# Patient Record
Sex: Female | Born: 1960 | Race: White | Hispanic: No | Marital: Married | Smoking: Former smoker
Health system: Southern US, Community
[De-identification: ages and names within clinical notes are randomized; demographics above are authoritative.]

## PROBLEM LIST (undated history)

## (undated) DIAGNOSIS — F32A Depression, unspecified: Secondary | ICD-10-CM

## (undated) DIAGNOSIS — F419 Anxiety disorder, unspecified: Secondary | ICD-10-CM

## (undated) DIAGNOSIS — F1120 Opioid dependence, uncomplicated: Secondary | ICD-10-CM

## (undated) DIAGNOSIS — F329 Major depressive disorder, single episode, unspecified: Secondary | ICD-10-CM

## (undated) DIAGNOSIS — K551 Chronic vascular disorders of intestine: Secondary | ICD-10-CM

## (undated) DIAGNOSIS — F319 Bipolar disorder, unspecified: Secondary | ICD-10-CM

## (undated) HISTORY — PX: ABDOMINAL HYSTERECTOMY: SHX81

## (undated) HISTORY — PX: PORT-A-CATH REMOVAL: SHX5289

## (undated) HISTORY — PX: CHOLECYSTECTOMY: SHX55

## (undated) HISTORY — PX: OTHER SURGICAL HISTORY: SHX169

---

## 2002-04-20 ENCOUNTER — Encounter: Payer: Self-pay | Admitting: Emergency Medicine

## 2002-04-20 ENCOUNTER — Inpatient Hospital Stay (HOSPITAL_COMMUNITY): Admission: EM | Admit: 2002-04-20 | Discharge: 2002-04-24 | Payer: Self-pay | Admitting: Emergency Medicine

## 2002-04-22 ENCOUNTER — Encounter: Payer: Self-pay | Admitting: Internal Medicine

## 2003-01-20 ENCOUNTER — Encounter: Admission: RE | Admit: 2003-01-20 | Discharge: 2003-04-20 | Payer: Self-pay | Admitting: Anesthesiology

## 2003-01-29 ENCOUNTER — Inpatient Hospital Stay (HOSPITAL_COMMUNITY): Admission: EM | Admit: 2003-01-29 | Discharge: 2003-02-08 | Payer: Self-pay | Admitting: Emergency Medicine

## 2003-02-24 ENCOUNTER — Ambulatory Visit (HOSPITAL_COMMUNITY): Admission: RE | Admit: 2003-02-24 | Discharge: 2003-02-24 | Payer: Self-pay | Admitting: Internal Medicine

## 2003-02-26 ENCOUNTER — Inpatient Hospital Stay (HOSPITAL_COMMUNITY): Admission: EM | Admit: 2003-02-26 | Discharge: 2003-03-05 | Payer: Self-pay | Admitting: Emergency Medicine

## 2003-03-01 ENCOUNTER — Encounter (INDEPENDENT_AMBULATORY_CARE_PROVIDER_SITE_OTHER): Payer: Self-pay | Admitting: Specialist

## 2003-03-04 ENCOUNTER — Encounter (INDEPENDENT_AMBULATORY_CARE_PROVIDER_SITE_OTHER): Payer: Self-pay | Admitting: Specialist

## 2003-03-08 ENCOUNTER — Encounter: Admission: RE | Admit: 2003-03-08 | Discharge: 2003-03-23 | Payer: Self-pay | Admitting: Anesthesiology

## 2003-03-23 ENCOUNTER — Inpatient Hospital Stay (HOSPITAL_COMMUNITY): Admission: EM | Admit: 2003-03-23 | Discharge: 2003-03-26 | Payer: Self-pay | Admitting: Emergency Medicine

## 2003-04-01 ENCOUNTER — Emergency Department (HOSPITAL_COMMUNITY): Admission: EM | Admit: 2003-04-01 | Discharge: 2003-04-01 | Payer: Self-pay | Admitting: Emergency Medicine

## 2003-04-23 ENCOUNTER — Inpatient Hospital Stay (HOSPITAL_COMMUNITY): Admission: EM | Admit: 2003-04-23 | Discharge: 2003-04-28 | Payer: Self-pay | Admitting: Emergency Medicine

## 2003-05-04 ENCOUNTER — Encounter: Admission: RE | Admit: 2003-05-04 | Discharge: 2003-08-02 | Payer: Self-pay | Admitting: Anesthesiology

## 2003-05-11 ENCOUNTER — Emergency Department (HOSPITAL_COMMUNITY): Admission: EM | Admit: 2003-05-11 | Discharge: 2003-05-12 | Payer: Self-pay | Admitting: Emergency Medicine

## 2003-05-16 ENCOUNTER — Emergency Department (HOSPITAL_COMMUNITY): Admission: EM | Admit: 2003-05-16 | Discharge: 2003-05-16 | Payer: Self-pay | Admitting: Emergency Medicine

## 2005-12-05 IMAGING — XA IR CV CATH FLUORO GUIDE
1 series · 1 of 1 positions shown · non-contrast
Comparison: none

CLINICAL DATA: UPPER EXTREMITY PICC PLACEMENT WITH ULTRASOUND AND FLUORO GUIDANCE
TECHNIQUE: The left arm was prepped with Betadine, draped in the usual sterile fashion, and infiltrated locally with 1% Lidocaine. Ultrasound demonstrated patency of the left basilic vein. Under real-time ultrasound guidance, this vein was accessed with a 21 gauge micropuncture needle. Ultrasound image documentation was performed. The needle was exchanged over a guidewire for a peel-away sheath through which a 5 French double lumen PICC catheter trimmed to 45 cm was advanced, positioned with its tip at the distal SVC/right atrial junction. Fluoroscopy during the procedure and fluoro spot radiograph confirms appropriate catheter position. The catheter was flushed, secured to the skin with Prolene sutures, and covered with a sterile dressing. No immediate complication. 

 IMPRESSION
 Technically successful left arm PICC placement with ultrasound and fluoroscopic guidance. Ready for routine use.

[Series 1000: run · 0.23mm/px · 1 of 1 slices shown]
[im 1/1]
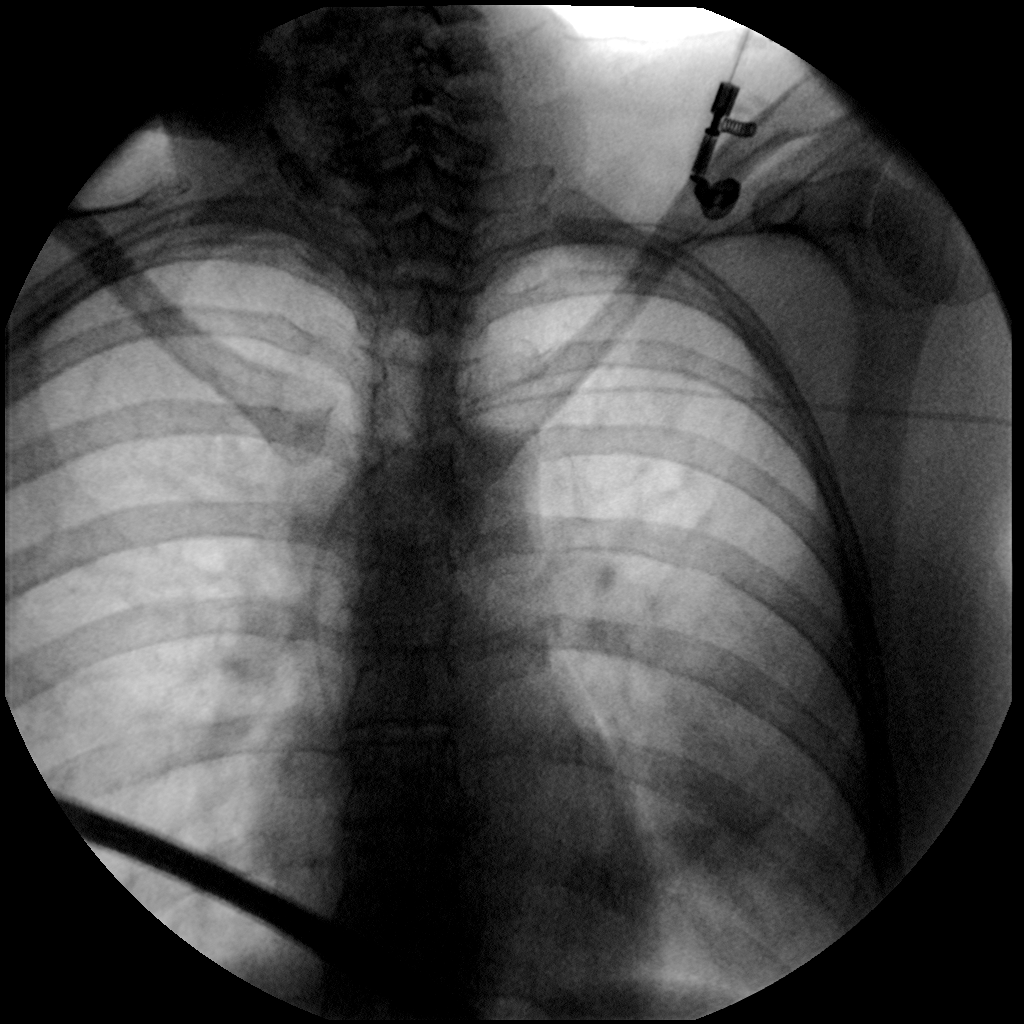

[1 of 1 positions shown; findings below may reference images not displayed]

## 2006-04-09 ENCOUNTER — Ambulatory Visit (HOSPITAL_COMMUNITY): Admission: RE | Admit: 2006-04-09 | Discharge: 2006-04-09 | Payer: Self-pay | Admitting: Cardiology

## 2006-06-14 ENCOUNTER — Ambulatory Visit (HOSPITAL_COMMUNITY): Admission: RE | Admit: 2006-06-14 | Discharge: 2006-06-14 | Payer: Self-pay | Admitting: Orthopedic Surgery

## 2006-07-11 ENCOUNTER — Ambulatory Visit (HOSPITAL_COMMUNITY)
Admission: RE | Admit: 2006-07-11 | Discharge: 2006-07-11 | Payer: Self-pay | Admitting: Physical Medicine and Rehabilitation

## 2007-09-03 ENCOUNTER — Emergency Department (HOSPITAL_COMMUNITY): Admission: EM | Admit: 2007-09-03 | Discharge: 2007-09-03 | Payer: Self-pay | Admitting: Emergency Medicine

## 2010-05-07 ENCOUNTER — Encounter: Payer: Self-pay | Admitting: Emergency Medicine

## 2010-05-07 ENCOUNTER — Ambulatory Visit (INDEPENDENT_AMBULATORY_CARE_PROVIDER_SITE_OTHER): Payer: Commercial Managed Care - PPO | Admitting: Emergency Medicine

## 2010-05-07 DIAGNOSIS — J029 Acute pharyngitis, unspecified: Secondary | ICD-10-CM

## 2010-05-08 ENCOUNTER — Encounter: Payer: Self-pay | Admitting: Emergency Medicine

## 2010-05-10 ENCOUNTER — Telehealth (INDEPENDENT_AMBULATORY_CARE_PROVIDER_SITE_OTHER): Payer: Self-pay | Admitting: *Deleted

## 2010-05-16 NOTE — Progress Notes (Signed)
  Phone Note Outgoing Call   Call placed by: Clemens Catholic LPN,  May 10, 2010 12:18 PM Call placed to: Patient Summary of Call: call back: pt states that she was unable to take Prenisone, it caused vommiting. she is taking ABT and is starting to feel better. advised her to complete ABT and to F/U with PCP if she fails to improve or worsens. pt agrees. Initial call taken by: Clemens Catholic LPN,  May 10, 2010 12:18 PM

## 2010-05-16 NOTE — Medication Information (Signed)
Summary: PRESCRIPTION  PRESCRIPTION   Imported By: Tawni Carnes 05/08/2010 15:55:47  _____________________________________________________________________  External Attachment:    Type:   Image     Comment:   External Document

## 2010-05-16 NOTE — Progress Notes (Signed)
Summary: OFFICE NOTE  OFFICE NOTE   Imported By: Tawni Carnes 05/08/2010 15:55:20  _____________________________________________________________________  External Attachment:    Type:   Image     Comment:   External Document

## 2010-05-16 NOTE — Assessment & Plan Note (Signed)
Summary: SORE THROAT/WSE    The patient and/or caregiver has been counseled thoroughly with regard to medications prescribed including dosage, schedule, interactions, rationale for use, and possible side effects and they verbalize understanding.  Diagnoses and expected course of recovery discussed and will return if not improved as expected or if the condition worsens. Patient and/or caregiver verbalized understanding.

## 2010-07-14 NOTE — H&P (Signed)
NAMEMAKAYAH, PAULI NO.:  [id number]   MEDICAL RECORD NO.:  [id number]                   PATIENT TYPE:  INP   LOCATION:  0374                                 FACILITY:  [facility]   PHYSICIAN:  Iva Boop, M.D. [facility]           DATE OF BIRTH:  1960/08/21   DATE OF ADMISSION:  02/26/2003  DATE OF DISCHARGE:                                HISTORY & PHYSICAL   CHIEF COMPLAINT:  Weakness, dizziness, shortness of breath, and a racing  heart.   HISTORY:  Yanna is a 50 year old white female known to the GI service with  a recent admission in December of 2004.  She was on the medicine service at  that time and admitted with a low hemoglobin at 7.3.  Also with chronic  abdominal pain, for pain control.  The patient has a chronic abdominal pain  syndrome and has been requiring narcotics for some time.  At this point, has  had three prior celiac blocks for her chronic left upper quadrant abdominal  pain.  She also is chronically malnourished and is on home TNA.  She has a  history of adult-onset diabetes mellitus, GERD, and an apparent history of a  SMA syndrome for which she underwent a bypass procedure in 1995.  Subsequently has had multiple abdominal surgeries, including a small bowel  resection and apparently a partial colectomy.  We do not have all of the  records from her prior hospitalizations.  She had been living in [place] and  had recently moved back to the [place] area to be with her family with  her declining condition.  She had also been referred to [facility] at one point  and referred to Dr. Oren Binet at the [facility] because of  her chronic abdominal pain.  She has been since become established with the  pain clinic here and has seen Dr. Stevphen Rochester.   Her last admission with hemoglobin of 7.3.  She had no evidence of GI blood  loss; however, she was transfused four units of packed RBCs but did not have  a final hemoglobin done  post-transfusion.  She was found to be B12 deficient  with a B12 level of 93, folate level of 7.5.  Iron studies showed an iron of  17, TIBC of 291, iron saturations of 6, and a ferritin of 28.  She was given  an iron infusion per Dr. Lina Sar and started on B12 injections weekly  initially.  She was sent home on TNA, which was to be 15 hours per day, but  apparently the home health agency has been giving her formula 12 hours per  day, and she has subsequently lost weight.   She did see Dr. Stevphen Rochester since discharge and underwent a celiac block on  February 09, 2003, which she says helped a bit for about a week, and now  pain control is an  issue again, despite a Duragesic patch and OxyIR on a  p.r.n. basis.  In the ER today, hemoglobin is 6.3.  She says she has been  feeling bad over the past several days with increasing weakness, dizziness,  faintness with bending over, shortness of breath with ambulation and a  sense that her heart races with any exertion.  She has not noted any melena  or hematochezia.  Denies any vomiting, hematemesis, etc.  The home health  nurse had drawn labs yesterday, apparently showing a hemoglobin in the 7  range.  They repeated it, as above, in the ER with a hemoglobin of 6.3.  She  is admitted at this time for further workup and transfusion.   CURRENT MEDICATIONS:  1. Klonopin 1 mg p.o. t.i.d. p.r.n.  2. Premarin 0.625 daily.  3. Nexium 40 daily.  4. Reglan 10 mg p.o. q.6 h.  5. Duragesic 50 mcg q.72 h.  6. OxyIR q.2-3 h. p.r.n.  7. TNA 12 hours daily.   ALLERGIES:  1. CODEINE.  2. MORPHINE, which is ineffective.  3. ZINC.   PAST MEDICAL HISTORY:  As outlined above.  She has a chronic left upper  quadrant abdominal pain labeled as pancreatitis in the past.  She has  history of SMA syndrome.  Status post bypass procedure in 1995.  She is  status post bilateral tubal ligation at age 2.  Total abdominal  hysterectomy in 1986.  Right oophorectomy in  1987.  She is status post  cholecystectomy in 1989.  Apparently had a partial colectomy in 1988.  Fistulectomy in 1989.  Small bowel obstruction in 1998.  Lysis of adhesions  in 1997.   FAMILY HISTORY:  Negative for GI.  Pertinent for breast cancer and lung  cancer.   SOCIAL HISTORY:  The patient is married.  Has two grown children who both  live in [place].  She is separated currently from her husband.  Moved  to [place] to live with her sister due to her illness.  She is a smoker.  No EtOH.   REVIEW OF SYSTEMS:  Positive, as above.  She has occasional substernal chest  pain.  She has chronic left upper quadrant abdominal pain, chronic nausea,  intermittent vomiting.  She has had progressive weakness and dizziness over  the past week or so.  No fevers or chills.   PHYSICAL EXAMINATION:  GENERAL:  A well-developed, chronically ill white  female in no acute distress.  She is alert.  VITAL SIGNS:  Temperature 97, blood pressure 91/55, pulse 110, respirations  16.  HEENT:  Atraumatic and normocephalic.  EOMI.  PERRLA.  Sclerae are  anicteric.  Conjunctivae are pale.  NECK:  Supple.  CARDIOVASCULAR:  Slightly tachycardic.  Regular rhythm with S1 and S2.  PULMONARY:  Clear to A&P.  ABDOMEN:  Soft.  Bowel sounds are active.  She is mildly tender in the left  upper abdomen.  No guarding or rebound.  No mass or hepatosplenomegaly.  RECTAL:  No stool.  Mucus is heme negative.  No mass.  EXTREMITIES:  Without clubbing, cyanosis or edema.  NEUROLOGICAL:  Nonfocal.   LABORATORY DATA:  In the ER, WBC 4.6, hemoglobin 6.3, hematocrit 18, MCV 83,  platelets 247.   IMPRESSION:  1. This is a 50 year old white female with recurrent severe anemia, acute     and chronic, with diagnosis of iron deficiency and B12 deficiency, felt     secondary to malnutrition and/or malabsorption.  Question short gut  syndrome, status post multiple abdominal surgeries. 2. Malnutrition:  Chronic with  inability to eat secondary to chronic pain     syndrome.  Unclear whether there is any other psychologic component here;     i.e., eating disorder.  3. Chronic abdominal pain syndrome.  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  5. Adult-onset diabetes mellitus.  6. Gastroesophageal reflux disease.  7. Anxiety.   PLAN:  The patient is admitted to the service of Dr. Stan Head and will  check baseline laboratories, repeat her iron studies, B12, folate, LDA,  reticulocyte counts, etc.  Will obtain Hematology consultation.  Will  continue TNA  and increased to 18 hours per day.  Will insist on CT scan of the abdomen  and pelvis this admission.  She may need to undergo endoscopic evaluation  for further clarification as well and probably should have psychiatric  consultation this admission as well.     Mike Gip, P.A.-C. [facility]                Iva Boop, M.D. [facility]    AE/MEDQ  D:  02/26/2003  T:  02/26/2003  Job:  [id number]

## 2010-07-14 NOTE — H&P (Signed)
NAME:  Mary, Hickman                        ACCOUNT NO.:  [id number]   MEDICAL RECORD NO.:  [id number]                   PATIENT TYPE:  INP   LOCATION:  0101                                 FACILITY:  [facility]   PHYSICIAN:  Titus Dubin. Alwyn Ren, M.D. [facility]         DATE OF BIRTH:  07/11/1960   DATE OF ADMISSION:  04/23/2003  DATE OF DISCHARGE:                                HISTORY & PHYSICAL   Mary Hickman is a 50 year old female with a complicated GI history which  is basically malnutrition in the setting of multiple abdominal surgeries and  a previous history of pancreatitis.  Now admitted with abdominal pain,  nausea, vomiting, diarrhea for six days.  She had been told to use Phenergan  suppositories but this was not possible due to the diarrhea.  In the  emergency room, Phenergan, Zofran IV failed to control the nausea and  vomiting.  She said she has been vomiting 6-9 times a day to the point that  the vomitus is like poop.  She has had 20-24 abdominal surgeries.  Initially, she had a fistula following cholecystectomy.  Subsequently, she  had 15 inches of small-bowel removed.  She also had sludge of the colon  removed after cholecystectomy.  Apparently, there was some device left in  place for an extended period of time to facilitate drainage and healing.  She was most recently hospitalized in January 2005 for a similar illness.  At that time, she had an endoscopy and colonoscopy.  Apparently, her gut has  atrophied.  She also had a hysterectomy which was her initial abdominal  pelvic surgery.   The remainder of her past medical history is outlined in the prior volumes.   She had been on TNA from August 2003 until 2005.  At that time, the central  line was pulled and since that time she has had increasing weight loss.   She is on oxycodone, acetaminophen, and Duragesic patch from Dr. Steele Berg at  the [facility].  She is also on Nexium, Premarin and Promethazine.   She is  allergic or intolerant to:  1. CODEINE.  2. MORPHINE SULFATE.  3. REMERON.  4. ZINC.    FAMILY HISTORY:  Negative for GI disease.   REVIEW OF SYSTEMS:  Positive for a sore throat related to the vomitus.  She  has had multiple celiac plexus injections by Dr. Steele Berg to facilitate pain  control.  With this illness this week, she has had a temperature up to  101.2.  She denies any cough or sputum production, no genitourinary  symptoms.   PHYSICAL EXAMINATION:  GENERAL:  She appears powerless and chronically ill.  VITAL SIGNS:  Blood pressure is 104/69, pulse varies from 88-128, the blood  pressure has been as low as 75/49.  O2 sats were 98-100%.  HEENT:  Pupils equal, round and reactive to light.  There is no sclera  icterus.  The otolaryngologic exam is  unremarkable.  Dental hygiene is  surprisingly good in view of her chronic illness.  She has no thyromegaly.  There is no lymphadenopathy about the head, neck or axilla.  LUNGS:  Minimal rales are noted.  Her chest is essentially clear.  ___________.  ABDOMEN:  Diffusely tender with decreased bowel sounds.  It is not a  surgical abdomen.  She has multiple abdominal scars.  Femoral pulses are  intact.  EXTREMITIES:  There is full range of motion.  She is a very erudite  individual.   LABORATORIES:  Included a white count 12,800, hematocrit of 36.  GOT was  minimally elevated at 42, total bilirubin was 1.4.   By history this acute event is not related to any recent antibiotic.  She  states she has not taken antibiotics for six months.  She has had no sick  pets.  Water, apparently the city water.  Her only travel was to [place]  which she lived for a short period of time.  She traveled there earlier this  month ___________ separation.   She will now be admitted with:  1. Abdominal pain.  2. Nausea and vomiting with parenteral TNA.   PLAN:  She will be kept NPO.  Dilaudid and Zosyn will be used.                                                Titus Dubin. Alwyn Ren, M.D. [facility]    WFH/MEDQ  D:  04/23/2003  T:  04/24/2003  Job:  [id number]   cc:   Lina Sar, M.D. [facility]   Corwin Levins, M.D. [facility]

## 2010-07-14 NOTE — Discharge Summary (Signed)
NAME:  Mary Hickman, Mary Hickman                        ACCOUNT NO.:  [id number]   MEDICAL RECORD NO.:  [id number]                   Hickman TYPE:  INP   LOCATION:  0374                                 FACILITY:  [facility]   PHYSICIAN:  Iva Boop, M.D. [facility]           DATE OF BIRTH:  Jan 22, 1961   DATE OF ADMISSION:  02/26/2003  DATE OF DISCHARGE:  03/05/2003                                 DISCHARGE SUMMARY   ADMITTING DIAGNOSES:  72. A 50 year old female with recurrent severe anemia, acute and chronic with     component of iron deficiency and B12 deficiency felt secondary to     malnutrition and/or malabsorption, question short gut syndrome status     post multiple abdominal surgeries.  2. Malnutrition, chronic, with inability to eat secondary to chronic     abdominal pain syndrome.  3. Chronic abdominal pain syndrome, etiology not clear.  4. History of superior mesenteric artery syndrome status post multiple     abdominal surgeries.  5. Adult-onset diabetes mellitus.  6. Gastroesophageal reflux disease.  7. Anxiety.   DISCHARGE DIAGNOSES:  1. Improved anemia without evidence of gastrointestinal blood loss felt to     be consistent with anemia of chronic disease with iron and B12     deficiency.  2. Chronic functional abdominal pain syndrome with negative diagnostic work-     up this admission and no objective evidence on CT for chronic     pancreatitis.  3. Narcotic dependence.  4. Malnutrition and weight loss secondary to inadequate oral intake on     chronic TNA with appropriate adjustments this admission.  5. Gastroesophageal reflux disease.  6. Adult-onset diabetes mellitus.  7. Anxiety.  8. History of superior mesenteric artery syndrome status post multiple prior     abdominal surgeries.  9. Hypokalemia, resolved.   CONSULTS:  Hematology, Dr. Cyndie Chime.   PROCEDURE:  1. Upper endoscopy with gastric and small bowel biopsies.  2. Colonoscopy with biopsies.  3. CT scan of  Mary abdomen and pelvis.   BRIEF HISTORY:  Mary Hickman is a 50 year old white female known to Mary GI service  from recent admission in December of 2004 at which time she was on Mary  medicine service and admitted with a hemoglobin of 7.3.  She also presents  with a chronic abdominal pain syndrome and narcotic dependence for such.  She had had three previous celiac blocks done elsewhere for her chronic left  upper quadrant abdominal pain.  Has had rather extensive work-up elsewhere  both at [place] [place] and in [place] for her chronic abdominal  pain and at one point had been given a diagnosis of chronic pancreatitis.  She is chronically malnourished and on home TPN chronically.  She does have  a history of adult-onset diabetes mellitus, GERD, and apparent history of  SMA syndrome for which she underwent a bypass procedure in 1995.  Subsequently, has had multiple other abdominal surgeries  including a small  bowel resection and apparently a partial colectomy.  We do not have all of  her previous records.  She had been living in [place] and recently moved to  [place] to be with her family secondary to her declining condition.  She  has become established with Mary pain clinic here and is in Mary process of  being evaluated by Dr. Stevphen Rochester.   Last admission her hemoglobin was 7.3.  There was no evidence of GI blood  loss.  She was transfused.  Found to be B12 deficient with a B12 level of  93, folate of 7.5.  Iron studies showed an iron of 17, TIBC 291, and iron  saturation of 6, ferritin of 28.  She had been given an IV iron infusion  during that admission and also was started on B12 injections for  replacement.  She was discharged to home on TNA which was to be given 15  hours a day.  Apparently, she was only getting in 12 hours a day per some  arrangement with Mary home health care agency and has since lost weight.  She  was to have a celiac block done on December 14.  This was done per  Dr.  Stevphen Rochester, lasted for approximately a week, and at this point she is scheduled  for a stronger block within Mary following week.  At this time she says she  has been feeling bad over Mary past few days with increasing weakness,  dizziness, and faintness.  Laboratories were drawn showing a hemoglobin of  7.  These were repeated in Mary emergency room on Mary day of admission, found  to have hemoglobin of 6.3 and she is admitted for further diagnostic work-up  and transfusions.   LABORATORIES:  On December 31 WBC 4.6, hemoglobin 6.3, hematocrit 18, MCV  83, platelets 247,000.  Serial values obtained on January 3:  WBC of 8.6,  hemoglobin 10.4, hematocrit 30.9, platelets 186,000.  Differential  unremarkable.  Reticulocyte 2.5, absolute reticulocyte 55.3, sedimentation  rate 3.  Pro time 13.4, INR 1.0, PTT 38.  Bleeding time of 5.5.  Electrolytes on admission showed a potassium of 3, glucose 93, BUN 12,  creatinine 0.7, albumin 3.  On January 6 electrolytes within normal limits,  albumin 2.6.  Liver function studies normal.  Lipase 19 on admission.  LDH  113.  Magnesium 1.9.  CK-MBs negative x3 on Mary 31st and 2nd.  Triglycerides  62, cholesterol 137.  Serum iron 23, TIBC 242, saturation 10, B12 369,  folate 528, ferritin 51.  a.m. cortisol normal at 5.6.  Also listed as 17  and 24, all within Mary normal range up to 22.4.  Prealbumin on admission  15.9.  Urinalysis negative.  Plasma porphyrins less than 1.   Small bowel biopsy:  No villous atrophy or other abnormalities.  Gastric  biopsy:  Mild chronic gastritis.  No H. pylori.  Gastric nodule:  Benign  fundic gland polyp.   X-ray studies:  CT scan of Mary abdomen and pelvis done on March 02, 2003  unremarkable and chest x-ray on January 2 no active disease.   HOSPITAL COURSE:  Mary Hickman was admitted to Mary service of Dr. Stan Head who was covering hospital.  She initially had multiple laboratories drawn.  Was transfused 3 units of  packed red blood cells.  She was continued  on TNA and we increased Mary rate initially to 18 hours per day and then 24  hours per  day to compensate for her recent weight loss.  She did have  hematology consultation with Dr. Cyndie Chime who felt that her anemia was  multifactorial with an element of iron and B12 deficiency, but with  inadequate time to assess response to recent iron infusion and B12  replacement.  Felt that she probably did have a component of anemia of  chronic disease.  He agreed with transfusions and increasing B12 injections  to daily x7 days and then weekly x4 weeks and then monthly.  He checked a  porphyria screen which was negative.  Did not feel that bone marrow was  indicated at this time.  Pain control continued to be an issue.  She was  continued on a Duragesic patch which was increased to 75 q.72h. and was  given Dilaudid p.r.n.  We did insist this admission that she undergo further  diagnostic work-up.  She was able to, with some encouragement, take contrast  and undergo CT scan of Mary abdomen and pelvis which was read as entirely  normal.  Also discussed upper endoscopy which was done on March 01, 2003.  She did have some mild atrophic appearing mottled mucosa.  Biopsies were  taken from both Mary stomach and Mary small bowel and unremarkable as outlined  above.  We then discussed colonoscopy which had not ever been done in Mary  past.  She was able to complete a bowel prep and underwent colonoscopy on  January 6.  Again, colon mucosa appeared normal.  There was some atrophic  appearing mucosa in Mary terminal ileum.  Biopsies were taken.  It was felt  that this had a firm, almost fixed feeling at Mary time of biopsy per Dr.  Leone Payor.  Those biopsies are pending at Mary time of discharge.   Mary Hickman was scheduled to undergo a celiac block with Dr. Stevphen Rochester earlier  during this week of her admission.  We had initially planned to let her have  that done.  However,  after discussion with Dr. Stevphen Rochester he was not certain  that this was going to offer her much benefit and without a diagnosis of  chronic pancreatitis it was decided not to proceed with Mary block.  Mary  Hickman was quite disappointed about this as she had felt that Mary previous  block had been helpful and was hoping that this would last longer.  On  January 7 she is stable for discharge to home.  She is at this time  tolerating clear liquids.  She is encouraged to continue clear to full  liquids at least t.i.d. to q.i.d., to continue some p.o. intake.  She will  be discharged home on TNA for 24 hours per day short-term until she  accomplishes some weight gain.  She will be followed by [facility].  She will also receive B12 injections 1 g IM weekly for three more weeks and  then plan monthly injection thereafter.   MEDICATIONS:  1. Duragesic 50-75 q.72h.  2. Reglan 10 mg q.i.d.  3. Clonazepam 1-2 mg p.o. daily.  4. Oxy-IR 5-10 q.2-4h. p.r.n. 5. Premarin 0.625 daily.  6. Nexium 40 daily.  7. Phenergan 25-50 q.4h. p.r.n.   FOLLOWUP:  She has follow-up appointment with Dr. Lina Sar on January 20  at 1:15 p.m.  She has a follow-up visit with Dr. Stevphen Rochester on January 11 at 10  a.m. and arrangements are being made for appointment with Dr. Dellia Cloud for  counseling purposes.  Would also suggest that she  undergo evaluation at Mary  functional bowel clinic at [facility] and will defer this to Dr.  Juanda Chance.   CONDITION ON DISCHARGE:  Stable.     Mike Gip, P.A.-C. [facility]                Iva Boop, M.D. [facility]    AE/MEDQ  D:  03/05/2003  T:  03/05/2003  Job:  [id number]   cc:   Celene Kras, MD  Fax: [phone]

## 2010-07-14 NOTE — Discharge Summary (Signed)
NAME:  Mary Hickman, Mary Hickman                           ACCOUNT NO.:  [id number]   MEDICAL RECORD NO.:  [id number]                   PATIENT TYPE:  INP   LOCATION:  0478                                 FACILITY:  [facility]   PHYSICIAN:  Valetta Mole. Swords, M.D. [facility]           DATE OF BIRTH:  [birth year]   DATE OF ADMISSION:  03/23/2003  DATE OF DISCHARGE:  03/26/2003                                 DISCHARGE SUMMARY   DISCHARGE DIAGNOSES:  1. Chronic abdominal pain, thought to be functional.  2. Eating disorder, currently off of total parenteral nutrition.  3. History of anemia.  4. Major depression.  There has been some question raised in the chart about     Munchausen's syndrome (inappropriate use of PICC line).  5. Other medical history includes history of kidney stones, history of     degenerative joint disease, some question of chronic pancreatitis.  6. History of diabetes mellitus (on total parenteral nutrition previously).  7. Gastroesophageal reflux disease.  8. Hyperlipidemia.  9. History of pneumonia in 2004.  10.      Surgeries include bilateral tubal ligation at 50 years old, status     post total abdominal hysterectomy in 1986.  She has had an oophorectomy     in 1987, appendectomy in 1988, left oophorectomy in 1989, cholecystectomy     in 1989.  She has had a fistulectomy and partial colectomy in 1989.     Partial small bowel removal in 1990.  11.      Lysis of adhesions in 1997.  She has undergone celiac plexus blocks     at a pain clinic in [place].   DISCHARGE MEDICATIONS:  1. Duragesic patch 50 mcg, changed every 72 hours.  2. Reglan 10 mg before each meal and at bedtime.  3. Premarin 0.625 mg p.o. daily.  4. Nexium 40 mg p.o. daily.  5. Remeron 7.5 mg two tablets tonight, on January 28 she is to take three     tablets, and on January 29 start 30 mg p.o. q.h.s.  6. Klonopin 2 mg p.o. daily.  7. Phenergan p.r.n.   DIET:  She is to use Ensure three times a day and maintain a  regular diet as  tolerated.   CONDITION ON DISCHARGE:  Improved.   FOLLOW-UP PLANS:  With Dr. Juanda Chance February 20.  She is to see Dr. Jonny Ruiz in  two to three weeks, and she has a scheduled follow-up with Dr. Onalee Hua  __________.   HOSPITAL LABORATORY DATA:  Initial white count 15,000, discharge white count  10.9.  Hemoglobin 11.2 at the time of discharge, initial hemoglobin 8.3.  Iron level was low at 18.  B12 normal at 492.  Blood cultures negative.   HOSPITAL PROCEDURES:  Abdominal film on March 23, 2003, demonstrated no  acute abnormality.   HOSPITAL COURSE:  The patient admitted to the hospital on March 23, 2003.  See  Dr. Debby Bud' admission note for details.   Problem 1.  PATIENT WITH SIGNIFICANT ANEMIA:  Laboratories were checked with  laboratories as above.  The patient was seen in consultation by  gastroenterology.  It was decided to transfuse two units packed red blood  cells.   Problem 2.  GASTROINTESTINAL:  The patient was seen in consultation by  gastroenterology.  There were concerns from gastroenterology that the  patient may be manipulating the PICC line.  Also, the point was made that  the hemoglobin one week prior to admission was 11.5.  There was some concern  that the patient may be using the PICC line in an inappropriate manner.  It  was recommended by gastroenterology that the patient's PICC line be  discontinued prior to discharge.   Problem 3.  PSYCHIATRIC:  The patient was seen in consultation by Dr.  Jeanie Sewer.  Recommendations as listed in discharge medications.  His thought  was that the patient had a major depression in addition to an anxiety  disorder.                                               Bruce Rexene Edison Swords, M.D. [facility]    BHS/MEDQ  D:  05/21/2003  T:  05/21/2003  Job:  [id number]   cc:   Corwin Levins, M.D. [facility]

## 2010-07-14 NOTE — Assessment & Plan Note (Signed)
REASON FOR VISIT:  Mary Hickman comes to [facility]  today.  I evaluated her via health and history form, 14-point review of  systems, chaperoned visit.   #1 - 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  This is fine, I think that this is a good goal, and I  encourage her to be very proactive in her health care, as well as her  enabling profile.  In the interim she has been to the hospital and  apparently has been treated with more invasive procedures and has been moved  away from intravenous feeding.  They are trying to rebuild her gut up and  they are going to refer her to the  [facility], [place]  for some type of function bowel program.  This also has pain management  involved.   #2 - I still to this day cannot clearly understand if she has a clear pain  problem, or if she has narcotic habituation.  To this end I am going to  start moving away from controlled substances to see how she does.  This may  even help her gut.  She understands this.  I have clearly discussed this  with her, and there are no barriers to communication.   #3 - She has also extra patches at home and so I am not going to be giving  her any patches today.  She has taken upon herself to put a couple of  patches on at 50 mcg dosing and I caution her as to using medications we are  unaware of.  She agrees and states that she will bring in all her  medications.  I am going to probably at next visit drop her down to 25 mcg  and follow her closely.  At that time we go to a p.r.n. 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  Certainly, controlled  substances are an issue here.   OBJECTIVE:  Her abdomen is soft, nontender.  She has some discomfort over  the CVA, mostly myofascial.  No new neurological findings, motor, sensory,   reflexes.   IMPRESSION:  1. Function bowel disease.  2. Chronic abdominal pain.   PLAN:  As outlined above.  No controlled substances are given today and I am  going to follow up with her in 1 week.  She will bring all her medications  in.  Consider drug testing.  She is followed by primary care.  She awaits  referral to the [facility]      Celene Kras, MD   HH/MedQ  D:  05/04/2003 11:36:22  T:  05/04/2003 12:01:09  Job #:  [id number]

## 2010-07-14 NOTE — H&P (Signed)
[name], Mary Hickman NO.:  [id number]   MEDICAL RECORD NO.:  [id number]                   PATIENT TYPE:  INP   LOCATION:  0447                                 FACILITY:  [facility]   PHYSICIAN:  Corwin Levins, M.D. [facility]             DATE OF BIRTH:  1960-03-15   DATE OF ADMISSION:  01/29/2003  DATE OF DISCHARGE:                                HISTORY & PHYSICAL   CHIEF COMPLAINT:  Here with increasing dizziness over the last two or three  days with orthostatic symptoms, hemoglobin 7.2 per home health yesterday by  blood draw.   HISTORY OF PRESENT ILLNESS:  [name] is a 50 year old white female here  with worsening symptoms as above on top of increasing chronic nausea,  vomiting and abdominal pain.  She denies any fever or other problems.  She  has had Duragesic 25 mg, no longer helping.  She does have an appointment to  see Dr. Lina Sar of gastroenterology at [facility], February 22, 2003, as her first appointment; she has just [place]  [place].   PAST MEDICAL HISTORY:   ILLNESSES:  1. Chronic left upper quadrant pain, unclear etiology.  2. 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  3. History of renal stone x2.  4. DJD.  5. Anxiety/depression.  6. History of pancreatitis, July 2003.  7. Diabetes mellitus with TPN and Pancrease.  8. GERD.  9. Hypercholesterolemia.  10.      History of bilateral pneumonia for which she was in [facility],     March 2004, temporarily while she was here visiting family.   SURGERIES:  Surgeries numerous including:  1. BTL at 50 years old.  2. TAH in 1986.  3. Right oophorectomy in 1987.  4. Appendectomy in 1988.  5. Left oophorectomy in 1989.  6. Cholecystectomy in 1989.  7. Status post fistulectomy and partial colectomy in 1989.  8. Status post partial small bowel removal in 1990, approximately 5 feet per     patient.  9. Adhesiolysis in 1997.  10.      Status post two celiac plexus  blocks at the pain clinic, last one     December 31, 2002, just prior to moving to [place], for which she was     seen for the first time at our office, January 12, 2003.   SOCIAL HISTORY:  No tobacco.  No alcohol.  She just moved from [place] and  she has a sister and family here in [place].  She is currently now  separated from the husband, with increasing emotional strain.   FAMILY HISTORY:  Family history significant for breast cancer, lung cancer,  elevated cholesterol, hypertension, renal disease and diabetes.   ALLERGIES:  CODEINE, MORPHINE, ZINC.   CURRENT MEDICATIONS:  1. TPN with insulin.  2. Phenergan 25 to 50 mg p.r.n.  3.  Duragesic 25 mg patch.  4. OxyIR 5 to 10 mg q.4 h.  5. Nexium 40 mg p.o. daily.  6. Premarin 0.625 mg p.o. daily.  7. Valium 5 mg b.i.d.   REVIEW OF SYSTEMS:  Review of systems is otherwise noncontributory.   PHYSICAL EXAMINATION:  GENERAL:  [name] is a 50 year old white female.  She appears moderately ill and dizzy on exam today and orthostatic on exam  today.  VITAL SIGNS:  Blood pressure 90/60, respirations 20, pulse 88, and  temperature 97.3.  EENT:  Sclerae clear.  TMs clear.  Pharynx benign.  NECK:  Neck without lymphadenopathy, JVD or thyromegaly.  CHEST:  No rales or wheezing.  CARDIAC:  Regular rate and rhythm with no murmur.  Somewhat tachycardic.  ABDOMEN:  Abdomen soft with somewhat decreased breath sounds and upper  abdominal tenderness, worse on the left.  EXTREMITIES:  No edema.  NEUROLOGICAL:  Not done in detail.   LABORATORIES:  Hemoglobin 7.2 per patient report, though we will apparently  soon be getting a fax verifying this.   ASSESSMENT AND PLAN:  Anemia with orthostatic symptoms but no obvious blood  loss in the setting of increasing chronic abdominal pain, status post  numerous surgeries, on total parenteral nutrition per right chest Port-A-  Cath.  She is to be admitted, given intravenous fluids, antiemetics;   increase the Duragesic to 50 mg.  We will check routine laboratories  including CBC, but also iron, B12, amylase and lipase, as well as blood and  urine cultures.  We will type and cross for two units of packed red blood  cells and transfuse for less than 8.  We will also check abdominal films to  rule out obstruction with her history of adhesions.  She may need  gastroenterology consult.                                                Corwin Levins, M.D. [facility]    JWJ/MEDQ  D:  01/29/2003  T:  01/30/2003  Job:  [id number]

## 2010-07-14 NOTE — Discharge Summary (Signed)
Mary Hickman, Mary Hickman NO.:  [id number]   MEDICAL RECORD NO.:  [id number]                   PATIENT TYPE:  INP   LOCATION:  0457                                 FACILITY:  [facility]   PHYSICIAN:  Rene Paci, M.D. [facility]          DATE OF BIRTH:  [birth year]   DATE OF ADMISSION:  01/29/2003  DATE OF DISCHARGE:  02/08/2003                                 DISCHARGE SUMMARY   DISCHARGE DIAGNOSES:  1. Chronic abdominal pain consistent with chronic pancreatitis with nausea,     pain, and anorexia.  2. Malnutrition secondary to above.  3. Anemia with iron and B12 deficiency status post 4 units packed red blood     cells.  Discharge hemoglobin 9.8.  4. Chronic pain secondary to above.  5. Type 2 diabetes.  6. Fever blister.  7. Gastroesophageal reflux disease.  8. History of SMA syndrome status post surgery in 1995, lysis of adhesions     1997, cholecystectomy 1989, and resection of small bowel with multiple     operations.  9. Status post Port-A-Cath removal December 9 now with PICC for cyclic TNA.   DISCHARGE MEDICATIONS:  1. Methadone 20 mg p.o. b.i.d.  2. Duragesic patch 50 mcg q.72h.  3. Roxicodone 5 mg one p.o. q.4h. p.r.n. breakthrough pain.  4. Other medications are as prior to admission and include B12 1000 mcg     weekly x4 weeks, then q.month.  5. Nexium 40 mg p.o. daily.  6. Premarin 0.625 mg p.o. daily.  7. Reglan 10 mg p.o. q.6h.  8. Klonopin 1 mg p.o. b.i.d. p.r.n.  9. Phenergan 25-50 mg p.o. or IV q.4h. p.r.n.   HOSPITAL FOLLOWUP:  1. Scheduled with Celene Kras, MD tomorrow, December 14 for a celiac block     in anticipation of better pain control.  2. With her GI physician, Lina Sar, M.D. [facility], scheduled December 27 at 3     p.m.  Will need to reevaluate with CT abdomen, pelvis imaging with     contrast once patient is better able to tolerate p.o., hopefully after     celiac block.  3. She is also to contact her primary care  physician, Corwin Levins, M.D.     [facility], in the next two to three weeks to schedule an appointment for     maintenance of other issues.  4. Her home health TNA (cyclic HS) will be monitored by GI and primary care.     At this time pain medications as per Lina Sar, M.D. [facility], but may be     transferred to Celene Kras, MD if agreeable.   CONDITION ON DISCHARGE:  Chronically ill, but medically stable.   HOSPITAL COURSE:  Problem 1 - CHRONIC PAIN:  The patient is a 50 year old  chronically ill white female who presented to her primary care physician's  office complaining of fatigue and weakness.  She had  only recently moved  here from [place] where she had been followed at a tertiary care center for  symptoms of chronic pancreatitis.  She is on cyclic TNA through a Port-A-  Cath and appeared to be malnourished.  She was admitted for better control  of her pain and evaluation by GI services here.  Her pain patch was changed  to Duragesic 50 and she was also added methadone in addition to her  breakthrough Roxicodone.  This seemed to control her pain well.  Her nausea  was also controlled by Phenergan.  CT of abdomen/pelvis to better define  anatomy were unable to be performed during this admission as patient was  unable to tolerate p.o.  She has been scheduled with Celene Kras, MD at the  [facility] for a celiac block which will occur day after discharge.  It is  anticipated that after the celiac block patient may be able to tolerate  better p.o. and then be able to arrange for her CT abdomen/pelvis.  Regarding her chronic pancreatitis type symptoms, patient's TNA was  evaluated by pharmacy and adjusted for her nutritional needs.  This will be  continued through home health for as long as needed.  Because of concerns  for an infected Port-A-Cath, her Port-A-Cath was removed and replaced by a  peripheral inserted central line during this admission.  At the time of  discharge both lumens are  patent and available for use by home health.   Problem 2 - ANEMIA WITH IRON DEFICIENCY AND B12 DEFICIENCY:  The patient's  hemoglobin was 7.2 prior to admission and on further evaluation confirmed  B12 and iron deficiency likely related to her malnutrition status and  previous GI surgeries.  She was given B12 injections during this  hospitalization and will continue these weekly as described above.  She was  also given an iron infusion per Lina Sar, M.D. [facility].  During this  admission she had a total of 4 units packed red blood cells transfused and  discharge hemoglobin is anticipated to be in the upper 9 range (Last  hemoglobin checked was 7.8 prior to third and fourth units of packed red  blood cells transfused.  No post transfusion hemoglobin has been  documented.).   Problem 3 - TYPE 2 DIABETES:  The patient's glycemic control was overall  good during this hospitalization being managed by pharmacy through insulin  and the TNA.  There was one episode of hypoglycemia during changes in her  TNA but this has since been corrected.  No hemoglobin A1C was performed  during this admission.  Management per pharmacy through [facility] and her primary  care physician.   DISCHARGE LABORATORIES:  White count 9.1, hemoglobin 7.8 (prior to 2 units  packed red blood cells transfusion), platelet count 193,000.  BMET within  normal limits except for a calcium of 8.1 corrected for low albumin at 2.9,  phosphorous 2.6, normal differential.  Electrolytes within range.  LFTs  normal except for low albumin related to malnutrition.  Fasting lipid  profile normal.  TSH normal at 1.6.  Iron level 17, percent saturation 6%,  B12 93, folate 7.5.                                               Rene Paci, M.D. [facility]    VL/MEDQ  D:  02/08/2003  T:  02/08/2003  Job:  [id number]   cc:   Celene Kras, MD  Fax: [phone]   Lina Sar, M.D. [facility]  Corwin Levins, M.D. [facility]

## 2010-07-14 NOTE — Consult Note (Signed)
Mary, Hickman NO.:  [id number]   MEDICAL RECORD NO.:  [id number]                   PATIENT TYPE:  INP   LOCATION:  0457                                 FACILITY:  [facility]   PHYSICIAN:  Abigail Miyamoto, M.D.              DATE OF BIRTH:  [birth year]   DATE OF CONSULTATION:  02/04/2003  DATE OF DISCHARGE:                                   CONSULTATION   CHIEF COMPLAINT:  Infected Porta-cath.   HISTORY OF PRESENT ILLNESS:  This is a 50 year old female who has multiple  medical problems including chronic abdominal pain, SMA syndrome, history of  chronic pancreatitis and multiple abdominal procedures, who was admitted  with nausea, vomiting, abdominal pain and anemia. She has since had fever  and is felt to have an infected Porta-cath. Surgery has been consulted to  remove this port. The patient's reports that this port was placed  approximately a year ago in the state that she used to live in. She has had  multiple Porta-cath's in the past. Again today, she is otherwise without  complaint.   PAST MEDICAL HISTORY:  Again is significant for also having anxiety and  depression. Diabetes, reflux, hypercholesterolemia, chronic pain, bilateral  pneumonias.   PAST SURGICAL HISTORY:  Includes also bilateral tubal ligation,  hysterectomy, oophorectomies, appendectomy, cholecystectomy, partial  colectomies, small bowel excisions and multiple celiac blocks.   SOCIAL HISTORY:  Negative for tobacco and alcohol currently.   ALLERGIES:  CODEINE, MORPHINE, AND ZINC.   CURRENT MEDICATIONS:  Include TPN, Phenergan, Duragesic, Oxy-IR, Nexium,  Premarin, Valium.   REVIEW OF SYSTEMS:  Otherwise unremarkable.   PHYSICAL EXAMINATION:  GENERAL:  A thin female in no acute distress. Again,  she is thin in appearance.  VITAL SIGNS:  Temperature 101.5, blood pressure 90/52, pulse 87, respiratory  rate 18.  LUNGS:  Clear to auscultation bilaterally.  CARDIOVASCULAR:   Regular rate and rhythm.  ABDOMEN:  Soft. She has a right Porta-cath, which is clean appearing on the  skin.   LABORATORY DATA:  The patient's most recent creatinine is 0.5. Most recent  white blood cell count is 6.5 with a hemoglobin of 8.9.   ASSESSMENT/PLAN:  This is a patient with multiple medical problems who has  Porta-cath for home TNA, who now has apparent Porta-cath infection. At this  point, we will remove it in the operating room as requested by her referring  physicians. Risks of this were discussed with the patient including bleeding  and infection of the incision site. At this point, she wishes to proceed.                                               Abigail Miyamoto, M.D.    DB/MEDQ  D:  02/04/2003  T:  02/04/2003  Job:  [id number]

## 2010-07-14 NOTE — Consult Note (Signed)
Mary, Hickman NO.:  [id number]   MEDICAL RECORD NO.:  [id number]                   PATIENT TYPE:  INP   LOCATION:  0374                                 FACILITY:  [facility]   PHYSICIAN:  Genene Churn. Cyndie Chime, M.D.          DATE OF BIRTH:  02-28-1960   DATE OF CONSULTATION:  02/26/2003  DATE OF DISCHARGE:                                   CONSULTATION   This is a hematology consultation to evaluate this lady for a complex  anemia.   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  She was first pregnant at age 19 and again at age 22.  She  started to develop abdominal pain subsequent to her second pregnancy.  She  underwent a tubal ligation.  She continued to have abdominal symptoms.  She  underwent an oophorectomy on the right in 1986, then a hysterectomy in 1987,  an appendectomy in 1988, and a left oophorectomy in 1999.  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  She was diagnosed with a  SMA syndrome in 1995 and underwent a surgical procedure at [facility].  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  She  had a number of evaluations for this, including the [facility], the [facility] [place], and then an evaluation with Dr. Park Meo in [place].  It sounds like she had a sphincterotomy and a  pancreatic stent placed at that time.  She believes that the stent came out  due to persistent vomiting only about a week or so after it was placed.  She  was told that she had increased pressure in the pancreatic duct.  She has  had chronic pancreatitis since that time.  She has been on total parenteral  nutrition since  August 2003.  She has lost a significant amount of weight  from her baseline of 120 down to currently 96.  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  She has had three celiac plexus block procedures, most  recently done locally by Dr. Tonny Bollman on February 09, 2003.  She gets  transient relief after these procedures and feels like a normal person again  and is able to eat without any pain.   She was admitted to [facility] in March of this year with abdominal  pain and bilateral upper lobe pneumonia (aspiration?).  Hemoglobin was 10.3  on admission and fell to 8.6 posthydration.  Serum iron was 15 with  saturation 7%.  Serum ferritin was 94, felt to be spuriously elevated due to  the acute pneumonic process.  She was readmitted to this hospital on  January 29, 2003.  A hemoglobin was 7.3, iron 17, saturations 6%, TIBC 291,  ferritin 28, decreased B12 at 93, normal folic acid at 7.5.  She received a  total of 4 units of red blood cells and was started on B12 injection.  She  received a dose of parenteral iron (25 g of InFeD given on February 04, 2003).   Hemoglobin rose to 10.1 g with transfusion but drifted down to 7.8 g at time  of discharge on February 07, 2003.   She presented to the emergency department today with increasing dizziness  and dyspnea on mild exertion, and a CBC shows further fall in her hemoglobin  down to 6.3.  MCV is 84.  Reticulocyte count is 2.5%.  Bilirubin is 0.2, LDH  113.  Blood type is O positive with negative antibody screen, essentially  excluding an acute or delayed hemolytic transfusion reaction.  White count  is slightly lower than her baseline with baseline count normal and current  white count 4600 with 58% neutrophils, 30% lymphocyte, and 5% eosinophils.  Platelets normal at 247,000.  PT and PTT both normal.   She has not noticed any obvious bleeding.  She denied hematemesis,  hematuria, hematochezia, or melena.   PAST MEDICAL  HISTORY:  1. Anxiety and depression.  2. Renal stones.  3. She has had bilateral breast implants.   MEDICATIONS ON ADMISSION:  1. Cyclic TPN 12 hours daily.  2. Duragesic 50 mcg every 3 days.  3. Nexium 40 mg daily.  4. Premarin 0.625 mg daily.  5. OxyIR 5 mg q.2-3h.  6. Reglan 10 mg q.6h.   FAMILY HISTORY:  Father died of lung cancer.  Mother is still alive and  well.  Two female and three female siblings, all healthy.  Nobody with any GI  problems or blood problems.   SOCIAL HISTORY:  Divorced but still in contact with her ex-husband.  Two  healthy sons age 30 and 27.  The 55 year old is now developing abdominal  symptoms.  She does not smoke and does not use alcohol.  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   REVIEW OF SYSTEMS:  She does admit to photosensitivity and does get skin  rashes when she goes out in direct sunlight.  She denied any history of  clotting.  She denied any dark urine and denied hematuria.  She has had  atypical chest pain, and cardiology evaluations have been normal so far.   PHYSICAL EXAMINATION:  GENERAL:  A pleasant, intelligent, adequately  nourished woman in no distress.  SKIN:  Pale and anicteric.  No rash.  HEENT:  Pupils reactive to light.  Pharynx no erythema, exudate, or mass.  NECK:  Supple, no thyromegaly.  LUNGS:  Clear, resonant to percussion.  CARDIAC:  Regular cardiac rhythm.  No murmur.  No lymphadenopathy.  BREASTS:  Not examined.  ABDOMEN:  Soft, multiple surgical scars, positive bowel sounds, tender  primarily in the epigastric and left upper quadrant.  RECTAL:  Not done.  PELVIC:  Not done.  EXTREMITIES:  No edema, no calf tenderness.  NEUROLOGIC:  No focal deficit.   Preparation of a peripheral blood film for review is pending.   IMPRESSION:  Complex hypoproliferative anemia.   There is clearly an element of iron and B12 deficiency with inadequate time to assess response to recent parenteral iron and  B12 replacement.  It takes  4-6 weeks  to see a 1-2 g rise in hemoglobin after starting replacement, and  she is only out about 11 days.   There is probably an element of anemia of chronic.   There may be some element of a drug reaction in view of current mild  eosinophilia and mild leukopenia.   I doubt we are dealing with aplastic anemia.  There is no evidence for a  hemolytic process.   RECOMMENDATIONS:  1. I agree with blood transfusion.  2. I would give her B12 injections, 1 mg daily x 7 days, then weekly for 4     weeks, then monthly.  3. I will check blood for a porphyria screen, although I believe this will     have low yield.  No clinical or lab suspicion for paroxysmal nocturnal     hemoglobinuria at this time.   I do not think that she needs a bone marrow biopsy at present.  I would  prefer to monitor her response to parenteral vitamin and iron replacement  and then reevaluate at a suitable interval 6-8 weeks from now.   Thank you for this consultation.                                               Genene Churn. Cyndie Chime, M.D.    Lottie Rater  D:  02/26/2003  T:  02/26/2003  Job:  [id number]   cc:   Iva Boop, M.D. [facility]   Corwin Levins, M.D. [facility]   Celene Kras, MD  Fax: [phone]   Abigail Miyamoto, M.D.  [address]  [place]  [place] [address]  Fax: [phone]

## 2010-07-14 NOTE — Op Note (Signed)
NAMEQUINTERIA, CHISUM NO.:  [id number]   MEDICAL RECORD NO.:  [id number]                   PATIENT TYPE:  INP   LOCATION:  0457                                 FACILITY:  [facility]   PHYSICIAN:  Abigail Miyamoto, M.D.              DATE OF BIRTH:  [birth year]   DATE OF PROCEDURE:  02/04/2003  DATE OF DISCHARGE:                                 OPERATIVE REPORT   PREOPERATIVE DIAGNOSIS:  Infected Port-A-Cath.   POSTOPERATIVE DIAGNOSIS:  Infected Port-A-Cath.   OPERATION PERFORMED:  Removal of infected Port-A-Cath.   SURGEON:  Abigail Miyamoto, M.D.   ANESTHESIA:  One percent lidocaine with monitored anesthesia care.   ESTIMATED BLOOD LOSS:  Estimated blood loss is minimal.   OPERATION IN DETAIL:  The patient was brought to the operating room and  identified as Mary Hickman.  She was placed upon the operating table and  anesthesia was induced.  The right chest was prepped and draped in the usual  sterile fashion.  The old skin incision from the Port-A-Cath insertion site  on the right chest was then anesthetized with 1% lidocaine.  An incision was  then made with a #15 blade scalpel.  The incision was carried down to the  port, which was easily identified.  It was found not to be sutured in place.  The port and the catheter going into the right subclavian vein was  completely removed in its entirety.  A 3-0 Vicryl suture was used to close  the catheter tract.  The wound was then irrigated with some normal saline.  No purulence was identified.  The subcutaneous layer was closed with a  single interrupted 3-0 Vicryl suture and the skin was closed with  interrupted 3-0 nylon sutures.  Dry gauze was then placed.   The patient tolerated the procedure well.   COUNTS:  All counts were correct at the end of the procedure.   DISPOSITION:  The patient was then taken in stable condition from the  operating room to the recovery room.                       Abigail Miyamoto, M.D.    DB/MEDQ  D:  02/04/2003  T:  02/05/2003  Job:  [id number]

## 2010-07-14 NOTE — Assessment & Plan Note (Signed)
INDICATIONS FOR OFFICE VISIT:  Mary Hickman comes to he Center or Pain  Management today evaluated on health and history form 14 point review of  systems. She is an individual referred to [place] for evaluation of abdominal  pain, right upper quadrant secondary to chronic pancreatitis. She has had  difficulty with most activities of daily living and quality of life indices  with pain that is radiating to her back. She has had multiple episodes of  discomfort and difficulty with most normal activities including simple  chores around the house and particularly eating. She is supplemented with  TPN. She states there is no temporal relation today. Her pain is constant,  dull, aching and throbbing. Sometimes relieved with medications. States no  wish to harm self or others and 14 point review of systems health and  history form are reviewed.   MEDICATIONS:  Her medications include TPN, Nexium, Premarin, Phenergan,  Duragesic 25, Oxy-IR for breakthrough, Valium and she is on Ceftin secondary  to Porta-cath.   ALLERGIES:  ZINC AND CODEINE (which causes nausea).   PAST MEDICAL HISTORY:  Remarkable for frequent chest pain but apparently non-  cardiac in nature. Anxiety and is followed by primary care. She has had  multiple abdominal surgeries. She is unsure why she has her problems with  pancreatitis. Denies a significant drinking history. 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   SOCIAL HISTORY:  She is a smoker. She quit living with her sister. She is  currently not working.   REVIEW OF SYSTEMS/FAMILY HISTORY:  Otherwise noncontributory.   PHYSICAL EXAMINATION:  GENERAL:  A pleasant, thin female sitting  uncomfortably in bed. Gait, affect, appearance is normal range x3.  HEENT:  Unremarkable.  CHEST:  Clear to auscultation and percussion.  HEART:  Regular rate and rhythm. Without rub, murmur, or gallop.  ABDOMEN:  Examination  soft, tender, multiple abdominal surgeries. Well  healed incisions. Suprapubic and right upper quadrant pain. Good bowel  sounds. No hepatosplenomegaly. She has diffuse paralumbar myofascial  discomfort. Pain over PSIS. Pain with extension.  NEUROLOGIC:  She has no deficits motor sensory reflex.   IMPRESSION:  Chronic abdominal pain pancreatitis.   PLAN:  1. Will go ahead and initiate management of controlled substances.  2. I am not sure she is a candidate for celiac plexus block. Being on     antibiotic, and poor nutritional status, would also place her at risk.     She has had previous celiac plexus blocks, even while anticoagulated, and     I think the risk or benefit must clearly be demonstrated in her favor.     Unfortunately, these blocks only lasted a week or two and she had     hypotension associated with these blocks, despite pre-hydration.  3. We would most likely recommend a more conservative course. I do not think     that she is a candidate for neural ablation at this time. Perhaps an     intra-thecal drug delivery system might be an option but unfortunately, I     think her potential for infectious secondary infection would be very     high.   I will follow up with her in a few weeks. We are going to need to make sure  she has been off her antibiotics, is clear by primary are, and has normal  coagulation parameters. I will reconsider celiac plexus block, possible  splanic block  at that time. Extensive consultation discharge instructions given. We will  see her in followup. No barriers to communication.      Celene Kras, MD   HH/MedQ  D:  02/02/2003 08:54:47  T:  02/02/2003 09:26:19  Job #:  [id number]

## 2010-07-14 NOTE — Discharge Summary (Signed)
NAME:  Mary Hickman, Mary Hickman                        ACCOUNT NO.:  [id number]   MEDICAL RECORD NO.:  [id number]                   PATIENT TYPE:  INP   LOCATION:  0343                                 FACILITY:  [facility]   PHYSICIAN:  Rene Paci, M.D. [facility]          DATE OF BIRTH:  1961/01/27   DATE OF ADMISSION:  04/23/2003  DATE OF DISCHARGE:  04/28/2003                                 DISCHARGE SUMMARY   DISCHARGE DIAGNOSES:  1. Acute on chronic abdominal pain with nausea, vomiting, improved.     Question exacerbation by viral syndrome with low-grade fever.  Continue     medical management.  2. Anemia, multi-factorial.  Please see previous discharge summaries.     Hemoglobin at discharge 10.  3. Mild episodic _________, improved status post 2 L of intravenous fluids,     now tolerating p.o.  4. Chronically asymptomatic hypotension believed secondary to high dose     narcotics.  5. Chronic pain per Dr. Stevphen Rochester, et. al.  6. Complicated gastrointestinal history with multiple surgeries due to     proximal bowel resection and history of pancreatitis.  Upper     gastrointestinal series this admission normal.  Small-bowel follow-     through normal, status post gastrointestinal re-evaluation, Dr. Leone Payor     and Dr. Russella Dar.  7. History of glucose intolerance with TNA.  Off TNA x4 weeks.  8. Chronic malnutrition, multifactorial.  No indication for TNA, normal     albumin this admission at 4.   CONDITION ON DISCHARGE:  Medically improved, but remains chronically ill.   DISPOSITION:  Home.   HOSPITAL COURSE:  ACUTE ON CHRONIC NAUSEA, VOMITING, AND ABDOMINAL PAIN:  The patient is an unfortunate 50 year old woman with chronic GI issues well  known to the GI service who was readmitted the day of admission with  exacerbation of her nausea, vomiting, and dehydration.  She was given 2 L of  normal saline fluid in the ER, but due to persistent nausea unresponsive to  Phenergan and Zofran she was  referred for admission.  Unable to obtain IV  access on her, so p.o. fluids were gently pushed.  GI saw the patient in  consultation who ordered an upper GI series as well as small-bowel follow-  through.  ____________were negative for acute events, just showing post-  surgical changes.  With symptomatic treatment of Phenergan, Zofran, and  Dilaudid for pain, the patient's symptoms gradually resolved.  By the time  of discharge she is now tolerating small amount of protein shakes, Sprite,  ginger ale, with minimal nausea and vomiting.  She is back to her home  regimen of medications, and felt stable for discharge home.  Follow up with  GI, primary care, and pain clinic as previously scheduled.  Rene Paci, M.D. [facility]    VL/MEDQ  D:  04/28/2003  T:  04/28/2003  Job:  [id number]

## 2010-07-14 NOTE — Assessment & Plan Note (Signed)
The patient was seen in Pain Management today and revealed of 14 point  review of systems.  A chaperoned visit and at no time was this physician  left unattended.  1. In a compassion care arena, with a lengthy explanation, contact time in     excess of 15 minutes, we discussed our reluctance to go ahead and proceed     with another celiac plexus block.  The issue is that we know that this is     going to be a temporary block, and the risk, reward benefit does not     follow her.  Particularly, after the last block she had only one week     relief cycling and actually ended up in the hospital.   1. She is still on TPN, and is not a good candidate for alcohol ablation.     We had entertained this at some point, but she is young, I have some     issues regarding medical necessity, and I think there is other modifiable     features in her health profile that we could address prior to moving in     this direction.  Particularly, I will maximize analgesic capacity, by     leaving her at 50 mcg Duragesic, and increasing her Roxicodone to 15 mg.     Will see how she does here.  States not wish to harm herself or others.     Reviewed this medication, potential habituating nature, risk of this     medication, and cautions to driving and making important cognitive     decisions.  Reviewed patient care agreement.   Objectively the abdominal exam is really not significantly changed.  Actually her appearance is brighter, and although she is sometimes tearful  during the discussion, her affect is more engaging, her color is better, and  just in general she seems to have some improvement here.  Her abdominal exam  is not significantly changed.   IMPRESSION:  Abdominal pain, unspecified.   PLAN:  Conservative management, follow up in one month for medication  refill.  She understands that I will not go ahead and proceed with celiac  plexus block at this time, utilizing it as a rescue, and a clear  medical  necessity must be met.  Furthermore, the risk, reward benefit must lie  clearly in the patient's favor, with a clear diagnostic impression as to  utility.  I am still wondering if the diagnosis of her abdominal pain is of  a clear etiology, and not magnified.  Will see her in one month in follow  up.  Discharge instructions were given.      Celene Kras, MD   HH/MedQ  D:  03/23/2003 09:42:33  T:  03/23/2003 11:00:59  Job #:  [id number]

## 2010-07-14 NOTE — Assessment & Plan Note (Signed)
HISTORY:  The patient presents for pain management today.  I evaluated her  via the history form, a 14-point review of systems.  The patient comes in today and has recently been hospitalized.  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  I will await that  psychiatric evaluation prior to any interventional procedures, and I am  going to call for notes to clearly understand our position here.   PLAN:  She is being treated with TPN and aggressive management with  supplemental nutrition, administrated intravenously.  I do think she ought  to be maximized medically prior to any interventional procedure.  As I relate to her, these blocks do not last more than a week or two.  In  particular, the last block I did is stated by her that it helped, but in  fact she went back into the hospital.  This is somewhat contradictory, and I  will need to review the notes.  I do not find anything new here.  Her abdominal pain is not that dramatic,  and I do think we can treat her in a multimodality sense.  I will continue  her on Duragesic at 50 mcg, with Oxy-IR for break-through.  Again, I  reviewed the risks of these medications.  Other life style enhancements are reviewed.   PHYSICAL EXAMINATION:  Objectively the abdomen is soft, nontender.  She is  somewhat distractible in right upper quadrant discomfort, but good bowel  sounds.  No organomegaly.  No new neurological findings in the motor,  sensory, or reflexes.   IMPRESSION:  Chronic abdominal pain of unclear etiology.  The plan is outlined above.  Will follow up with her in three to four weeks  and renew her medication.  Call for records.  I am reluctant to perform any  interventional procedures until I have a clear diagnostic impression.      Celene Kras, MD   HH/MedQ  D:  03/09/2003 12:00:35  T:  03/09/2003 12:54:58  Job #:  [id number]

## 2010-07-14 NOTE — Assessment & Plan Note (Signed)
SUBJECTIVE:  Mary Hickman comes to the [facility] today to  evaluate her __________ 14 point review of systems.  At no times the  physician left unattended.  This was an extensive consultation and  coordination of care.   #1 - Mary Hickman comes today with new problem.  In reality, I do not see  anything new here.  She has her diffuse abdominal discomfort, and she is  concerned about re-exacerbation.  She is conversive, well kept, and in no  apparent distress.  Problematic is the issues that lay before [place].  She still  has a questionable diagnosis, and although our GI colleagues have been very  diligent, she has not either improved or declined in the past many, many  months.  In fact, she was on high dose narcotics and has had no significant  change in symptoms as she has come down on these.   #2 - I have attempted to slowly wean her away from narcotics and to move  away from controlled substances which could become problematic.  She  actually asked for this to be done.  I am not having success as an  outpatient.  We go ahead and arrange for her to go in as an inpatient for  detoxification, and essentially to start over.  She brings her Wygesic in,  which her family told her not to take, and she states that she only took 3  tablets and it made her stomach very upset.  She brings her Duragesic and  apparently that is fine.  She tolerates that patch, but I think we can just  move away from controlled substances all together.  She is on fairly low  level at this time, and detoxification should be fairly easy.  The actual  issues would be her compliance with detoxification.  She tends to want to  self direct care, and perhaps her psychiatric colleagues would help [place]  understand if she has a somatoform disorder, even as extreme as Munchausen's  or if she has a legitimate medical need for these controlled substances.  I  just do not see it at this time.   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  She chooses hospice, and this is a potential red flag, and I actually  discussed that with her.  She will be exposed to large doses of medications,  and I think that it will be a difficult experience for her, although, I am  not accusatory.  I just think that the risk environment is present, and we  want to minimize that risk environment.   OBJECTIVE:  Her abdominal has some discomfort, but she is fully distractible  with the stethoscope.  She has a rash on her neck that looks like she has  been rubbing, and there is nothing new on exam, motor/sensory reflexes or by  abdominal presentation.   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   PLAN:  1. I discussed with her about frequent calls after hours.  This will be     reserved for emergencies, and she will report to the emergency room if     she feels she has issues here.  2. If she calls about her abdominal pain, it needs to be clarified whether     this is a clear abdominal issue or a pain issues, and hands on would have     to actually occur.  This would  require either an ER visit or a follow up     the next morning.  We really cannot be adjusting any medications on call.  3. Full detoxification, starting over, reassess, and she will come back at     p.r.n. after detoxification.  I am not sure we have anything to offer her     here at the clinic.  If we do not feel the need for controlled     substances, certainly she can be managed by her GI colleagues, and will     remain in a supportive environment.  She is going to be going over to [facility]     to their bowel program and perhaps they can shed light on a somewhat     elusive diagnosis.   DISCHARGE INSTRUCTIONS:  Discharge instructions given.  No barriers to  communications.  Coordination of care.    Celene Kras, MD   HH/MedQ  D:  05/11/2003 10:53:09  T:  05/11/2003 11:23:09  Job #:  [id number]

## 2010-07-14 NOTE — Consult Note (Signed)
Mary Hickman, NADING NO.:  [id number]   MEDICAL RECORD NO.:  [id number]                   PATIENT TYPE:  REC   LOCATION:                                       FACILITY:  [facility]   PHYSICIAN:  Celene Kras, MD                     DATE OF BIRTH:  [birth year]   DATE OF CONSULTATION:  DATE OF DISCHARGE:                                   CONSULTATION   Tilford Pillar [facility] to evaluate her  history form, 14-point review of systems.   1. Magnolia's coags are reviewed and she is within normal limits.  She has     recently been hospitalized for control of pain.  Started on methadone and     increased Duragesic and Oxy-IR for breakthrough.  The  methadone was     intolerant and probably want to stick to both Duragesic and Oxy-IR and     hold off on a second pharmacokinetically long-acting drug, particularly     if there is hepatic metabolism issues.  2. I do not see any contraindication to proceeding with block today to     improve function of quality of life and she has demonstrated clear     response to these blocks with increased appetite and functional     enhancement.  3. I will go ahead and consider her for a neurolytic, but I want to have a     good hand at her response here.  She understands the risks of this as     bleeding, infection, nerve damage, stroke, seizure, death and other     unforeseen potential complications with the possibility of pneumothorax     and vascular collapse secondary to injection.  4. We have gone ahead and prehydrated her with 450 mL and she has two IVs in     place.   Objectively, no significant change in neurological, musculoskeletal  presentation.  She has diffuse paralumbar myofascial discomfort, abdominal  discomfort primarily in right upper quadrant, but no new neurological  findings or abdominal findings.   Her primary care physician calls today and clears her for injection.   IMPRESSION:  1. Abdominal pain.  2. Pancreatitis.   PLAN:  Celiac plexus block.  She has consented.   The back is prepped with DuraPrep and draped.  Using local anesthetic under  direct fluoroscopic observation and attention to strict aseptic technique,  we advanced to the anterior surface of L1,2 and confirmed placement in  multiple fluoroscopic positions.  We used a 22-guage spinal needle without  CSF, heme, or paresthesia test block uneventfully followed with Isovue 200,  then followed with test dose.  I then inject 5 mL of lidocaine 1% NPF and 20  mL of Aristocort.  She tolerated the procedure.  Appropriate recovery.  No  complications identified.  Discharge instructions given.  We will see her in  followup.  Improved at discharge. Consider a second injection if warranted.                                               Celene Kras, MD    HH/MEDQ  D:  02/09/2003  T:  02/09/2003  Job:  [id number]

## 2010-07-14 NOTE — Discharge Summary (Signed)
NAMEODA, PLACKE NO.:  [id number]   MEDICAL RECORD NO.:  [id number]                   PATIENT TYPE:  INP   LOCATION:  3027                                 FACILITY:  [facility]   PHYSICIAN:  Alvester Morin, M.D.               DATE OF BIRTH:  1960-09-20   DATE OF ADMISSION:  04/20/2002  DATE OF DISCHARGE:  04/24/2002                                 DISCHARGE SUMMARY   DISCHARGE DIAGNOSES:  1. Pneumonia.  2. Abdominal pain with nausea and vomiting.  3. Normocytic anemia.  4. History of chronic pancreatitis.  5. History of multiple abdominal surgeries.  6. Hypoalbuminemia.  7. Chest pressure.  8. Anion gap acidosis.   DISCHARGE MEDICATIONS:  1. Phenergan 25 mg p.o. q.4h. p.r.n. nausea or 25 mg Phenergan suppository     PR q.4h. p.r.n. nausea if unable to take p.o.  2. Duragesic 100 mcg and 50 mcg patches -- place one of each on skin every     three days after removal of old patches; next to change patch on April 26, 2002.  3. OxyIR 10 mg p.o. q.4h. p.r.n. breakthrough pain.  4. Tequin 400 mg p.o. daily x6 days -- to start April 25, 2002.  5. Scopolamine patch -- apply one patch behind ear every three days after     removal of old patch.  Patient to change patch next on April 26, 2002.   DISCHARGE INSTRUCTIONS:  1. Pain control with Duragesic patch and OxyIR as above.  2. Tylenol 650 mg p.o. q.4h. p.r.n. mild pain or fever.   ACTIVITY:  Activity as tolerated.   DIET:  TPN as provided by home care company and PFTs prior to  hospitalization.   SPECIAL INSTRUCTIONS:  Call your doctor or return to the emergency room if  develop fever, vomiting that does not resolve, severe pain not controlled  with medicines or any other problems or questions.   FOLLOWUP:  Follow up with your primary care doctor when you return to  [place].  Also follow up with Dr. Sheppard Plumber. Earlene Plater at [facility], Monday, May 21, 2002, at 1  p.m.   CONSULTS:  None.   PROCEDURES:  None.    STUDIES:  1. Acute abdomen series on April 20, 2002 showed right upper lobe and     left lower lobe patchy densities probably due to scarring, but may also     be due to pneumonia.  A moderate amount of stool was seen throughout the     colon.  No obstruction or free air.  2. Two-view of the chest on April 22, 2002 showed right upper lobe and     left lower lobe infiltrates associated with small bilateral pleural     effusions.   BRIEF ADMISSION HISTORY:  The patient is a 50 year old white female with a  history of multiple abdominal surgeries, chronic pancreatitis and Port-A-  Cath placement for TPN, who presented to the emergency department with a  four-day history of abdominal pain that turned into intractable nausea and  vomiting, now to the point of dry heaves.  Prior to the nausea and vomiting,  the patient complained of achy flu-like pain in her lower extremities.  The  patient took her prescription Phenergan at increased frequency without  relief.  She describes her emesis as bilious and watery.  The patient also  reported that three days prior to admission, she also developed some  shortness of breath and chest pain which she described as a pressure feeling  under her left breast and along the left side of her chest which radiated  through to her back.  She described the pain as greater than 10/10 and was  given Dilaudid 1 mg IV x2 in the emergency room with decrease in the pain to  8/10.  She also complained of an acid burning feeling at her throat in the  middle of her chest and described that eating made the chest pain worse, but  nothing really made it better.  The chest pain was described as constant,  like something was sitting on her chest.  The patient did report a fever of  101.5 on day of admission with chills and sweats.  On arrival to the ER, she  was found to be wheezing and was given an albuterol/Atrovent  nebulizer  treatment with relief.  Her Port-A-Cath needle was changed and sent for  culture.  The patient received normal saline bolus and continuous IV fluids.  The patient denied any diarrhea or sick contacts.   PHYSICAL EXAMINATION:  VITAL SIGNS:  In the emergency room, the patient was  afebrile with a temperature of 99.2, a blood pressure of 107/56, pulse of  100, respiratory rate of 24 and an O2 saturation of 96% on room air after  receiving her nebulizer treatment.  GENERAL:  She was pale, ill-appearing, in moderate discomfort.  HEENT:  Her sclerae were white, non-injected and her pupils were equally  round and reactive to light.  Her mucous membranes were moist and her  oropharynx was clear without erythema or exudate.  NECK:  Her neck was supple with left anterior cervical lymphadenopathy.  LUNGS:  Her lungs were clear to auscultation with good air flow noted.  HEART:  Her heart rate was bounding and tachycardic with a normal S1 and S2,  without murmurs, rubs, or gallops.  Peripheral pulses were 2+ and  symmetrical.  ABDOMEN:  Her abdomen showed numerous well-healed scars.  She had voluntary  guarding, no rebound, had normal bowel sounds and left-sided tenderness to  palpation, especially in the left upper quadrant.  EXTREMITIES:  Her extremities were thin without edema, clubbing or cyanosis.  SKIN:  Her skin was without rashes or lesions.  NEUROLOGIC:  Her strength was 5/5, bilateral upper and lower extremities.  Her cranial nerves were intact grossly and her reflexes were 2+, bilateral  patellar.   LABORATORY DATA:  Admission labs included a white count elevated at 32.3  with 93% neutrophils and greater than 20% bands.  Her hemoglobin was low at  10.3, platelets 269,000.  Serum sodium 134, potassium 3.5, chloride 105,  bicarb 16, BUN 13, creatinine 0.8, glucose 155, anion gap of 13, bilirubin 0.6, alkaline phosphatase 105, AST 24, ALT 13, protein 6.9, albumin low at  3.1,  calcium 9.0 and up to 9.72 when  corrected in terms of albumin.  Her  lipase was 19.  An i-STAT ABG showed a pH of 7.657, PCO2 of 11.5, PO2 of  114, bicarb of 13 and an O2 saturation of 99%, which was consistent with  respiratory alkalosis with metabolic acidosis compensation.   HOSPITAL COURSE:  #1 - ABDOMINAL PAIN WITH NAUSEA AND VOMITING IN A PATIENT  WITH A LONG HISTORY OF PANCREATITIS OF UNKNOWN ETIOLOGY, AS WELL AS A  HISTORY OF MULTIPLE ABDOMINAL SURGERIES:  Abdominal x-rays showed no signs  of obstruction and pancreatitis was suspected, despite normal lipase.  The  patient has been extensively worked up in the past at numerous hospitals  including [facility], [facility], [address] of [place].  The patient was made n.p.o. and started on Compazine, Phenergan and  scopolamine patch for her nausea.  Morphine was adequate for pain control.  As the patient was unable to tolerate oral contrast, we held on doing a CT  of the abdomen and pelvis as a CT without oral contrast would likely be  inadequate in evaluating the GI tract.  During hospitalization, the  patient's abdominal exam remained stable and, in fact, improved and at no  time did she have an acute abdomen.  As her abdominal series was suggestive  of constipation, the patient was given some laxatives with subsequent  production of a bowel movement and improvement in the patient's abdominal  pain.  Because of the patient's elevated white count and history of fever,  she was started on Zosyn, which she received for approximately two days  before her antibiotics were changed to cover for presumed community-acquired  pneumonia as described below.  The patient was maintained on her outpatient  TPN during hospitalization.  _______ level was obtained during  hospitalization and it was found to be within normal limits at 33.  Throughout hospitalization, the patient had no episodes of diarrhea, but  continued to have  some emesis and dry heaves, which resolved by the time of  discharge.  After diagnosis of pneumonia on chest x-ray and subsequent  change to Tequin, the patient continued to feel better and by the day of  discharge, felt that she was almost back to her baseline, which is some  nausea and abdominal pain.  Therefore, it is possible that the etiology of  her abdominal pain, nausea and vomiting was secondary to the pneumonia.  At  the time of discharge, she was set up to follow with a local general surgeon  and was also encouraged to try to find one physician who has records of all  of her previous surgeries and studies who can manage her condition.   #2 - PNEUMONIA:  As described above, right upper lobe and left lower lobe  infiltrates were seen on chest x-ray.  Blood cultures and culture from her  Port-A-Cath tip were negative.  During hospitalization, the patient received Zosyn for approximately two days, ceftriaxone and azithromycin for  approximately one day and then was started on Tequin 400 mg p.o.  She was to  continue this at the time of discharge to complete a week's course.  During  hospitalization, the patient's white count continued to trend down, having  been 32.3 initially with increased bands and becoming normal at 9.5 with a  normal differential by April 23, 2002.  Throughout hospitalization, the  patient maintained good saturations on room air with a normal respiratory  rate and for over 48 hours prior to discharge, she had been  afebrile.   #3 - CHEST PRESSURE:  EKG was checked in the emergency room, which showed a  normal sinus rhythm with heart rate of 96 beats per minute but no acute ST  changes.  Serial cardiac enzymes were obtained which were negative x3.  With  treatment of her pneumonia, her chest pressure resolved and therefore was  deemed secondary to her illness.   #4 - ANION GAP ACIDOSIS:  On admission, the patient had a bicarb of 16 with  an ABG showing  respiratory alkalosis with compensatory metabolic acidosis,  likely secondary to hyperventilation from pain.  Subsequent monitoring of  her bicarb showed normalization so that on the day of discharge, her bicarb  was normal at 27.   #5 - HYPOALBUMINEMIA:  The patient was on TPN prior to admission.  Prealbumin was obtained on April 20, 2002 and found to be low at 7.7.  Her albumin suggested severe depletion of her protein stores likely  secondary to malnutrition and illness.  Her TPN formulation was adjusted by  pharmacy to improve the patient's utilization of protein.  Her albumin  should be followed as an outpatient and her TPN adjusted as needed.   #6 - NORMOCYTIC ANEMIA:  Hemoglobin on admission was 10.3 and with IV  fluids, this decreased and remained stable at around 8.6 with an MCV of  around 81.  A reticulocyte count was obtained and found to be 0.9% and iron  studies showed serum iron low at 15, TIBC low at 2.6, percent saturation low  at 7 and a ferritin at 94.  With  the ferritin less than 100 in the setting of infection, this is suggestive  of iron deficiency anemia as well as possible anemia of chronic disease.  The patient was not started on iron at this time secondary to her acute  infection, but this should be considered in the future if deemed necessary.     Georgina Peer, M.D.                 Alvester Morin, M.D.    JM/MEDQ  D:  05/20/2002  T:  05/22/2002  Job:  [id number]   cc:   [place], [place]; Phone #[phone] Ferne Reus.D.

## 2010-11-23 LAB — URINALYSIS, MICROSCOPIC ONLY
Specific Gravity, Urine: 1.006
pH: 6.5

## 2010-11-23 LAB — POCT URINALYSIS DIP (DEVICE)
Bilirubin Urine: NEGATIVE
Ketones, ur: NEGATIVE
Nitrite: NEGATIVE
Operator id: 239701
Protein, ur: NEGATIVE
Specific Gravity, Urine: 1.01
Urobilinogen, UA: 0.2

## 2010-11-23 LAB — URINE CULTURE

## 2013-03-05 ENCOUNTER — Other Ambulatory Visit (HOSPITAL_COMMUNITY): Payer: Self-pay | Admitting: *Deleted

## 2013-03-06 ENCOUNTER — Encounter (HOSPITAL_COMMUNITY)
Admission: RE | Admit: 2013-03-06 | Discharge: 2013-03-06 | Disposition: A | Discharge status: Home / Self Care | Payer: 59 | Source: Ambulatory Visit | Attending: Family Medicine | Admitting: Family Medicine

## 2013-03-06 DIAGNOSIS — D509 Iron deficiency anemia, unspecified: Secondary | ICD-10-CM | POA: Insufficient documentation

## 2013-03-06 DIAGNOSIS — K565 Intestinal adhesions [bands], unspecified as to partial versus complete obstruction: Secondary | ICD-10-CM | POA: Insufficient documentation

## 2013-03-06 DIAGNOSIS — E46 Unspecified protein-calorie malnutrition: Secondary | ICD-10-CM | POA: Insufficient documentation

## 2013-03-06 LAB — PREALBUMIN: Prealbumin: 25.2 mg/dL (ref 17.0–34.0)

## 2013-03-06 LAB — DIFFERENTIAL
BASOS ABS: 0 10*3/uL (ref 0.0–0.1)
Basophils Relative: 1 % (ref 0–1)
Eosinophils Absolute: 0.1 10*3/uL (ref 0.0–0.7)
Eosinophils Relative: 2 % (ref 0–5)
LYMPHS ABS: 1.1 10*3/uL (ref 0.7–4.0)
Lymphocytes Relative: 18 % (ref 12–46)
MONO ABS: 0.5 10*3/uL (ref 0.1–1.0)
Monocytes Relative: 8 % (ref 3–12)
NEUTROS PCT: 72 % (ref 43–77)
Neutro Abs: 4.6 10*3/uL (ref 1.7–7.7)

## 2013-03-06 LAB — CBC
HEMATOCRIT: 36.9 % (ref 36.0–46.0)
HEMOGLOBIN: 12 g/dL (ref 12.0–15.0)
MCH: 27.6 pg (ref 26.0–34.0)
MCHC: 32.5 g/dL (ref 30.0–36.0)
MCV: 84.8 fL (ref 78.0–100.0)
PLATELETS: 276 10*3/uL (ref 150–400)
RBC: 4.35 MIL/uL (ref 3.87–5.11)
RDW: 13.3 % (ref 11.5–15.5)
WBC: 6.3 10*3/uL (ref 4.0–10.5)

## 2013-03-06 LAB — PHOSPHORUS: PHOSPHORUS: 3.5 mg/dL (ref 2.3–4.6)

## 2013-03-06 LAB — COMPREHENSIVE METABOLIC PANEL
ALK PHOS: 115 U/L (ref 39–117)
ALT: 24 U/L (ref 0–35)
AST: 24 U/L (ref 0–37)
Albumin: 3.7 g/dL (ref 3.5–5.2)
BUN: 33 mg/dL — AB (ref 6–23)
CALCIUM: 8.9 mg/dL (ref 8.4–10.5)
CO2: 24 mEq/L (ref 19–32)
Chloride: 97 mEq/L (ref 96–112)
Creatinine, Ser: 0.63 mg/dL (ref 0.50–1.10)
GFR calc Af Amer: >90 mL/min (ref >90)
GFR calc non Af Amer: >90 mL/min (ref >90)
Glucose, Bld: 80 mg/dL (ref 70–99)
Potassium: 4.9 mEq/L (ref 3.7–5.3)
SODIUM: 132 meq/L — AB (ref 137–147)
Total Bilirubin: <0.2 mg/dL — ABNORMAL LOW (ref 0.3–1.2)
Total Protein: 7.6 g/dL (ref 6.0–8.3)

## 2013-03-06 LAB — MAGNESIUM: Magnesium: 2 mg/dL (ref 1.5–2.5)

## 2013-03-06 LAB — TRIGLYCERIDES: Triglycerides: 148 mg/dL (ref <150)

## 2013-03-06 MED ORDER — HEPARIN SOD (PORK) LOCK FLUSH 100 UNIT/ML IV SOLN: 500.0000 [IU] | INTRAVENOUS | Status: DC | PRN

## 2013-03-06 MED ORDER — HEPARIN SOD (PORK) LOCK FLUSH 100 UNIT/ML IV SOLN
INTRAVENOUS | Status: AC
  Filled 2013-03-06: qty 5

## 2013-03-06 NOTE — Progress Notes (Signed)
L PAC reaccessed, no blood rtn despite flushing. Pt reports hx of no blood return. States will "get blood return after fluids running." Floor RN at bedside, recommend to run fluids or possible TPA. Blood drawn peripherally r ant fa. Ronette DeterJessica Poff, RN

## 2013-03-13 ENCOUNTER — Encounter (HOSPITAL_COMMUNITY)
Admission: RE | Admit: 2013-03-13 | Discharge: 2013-03-13 | Disposition: A | Discharge status: Home / Self Care | Payer: 59 | Source: Ambulatory Visit | Attending: Family Medicine | Admitting: Family Medicine

## 2013-03-13 MED ORDER — HEPARIN SOD (PORK) LOCK FLUSH 100 UNIT/ML IV SOLN
INTRAVENOUS | Status: AC
  Administered 2013-03-13: 500 [IU]
  Filled 2013-03-13: qty 5

## 2013-03-13 MED ORDER — HEPARIN SOD (PORK) LOCK FLUSH 100 UNIT/ML IV SOLN
500.0000 [IU] | INTRAVENOUS | Status: AC | PRN
  Administered 2013-03-13: 500 [IU]

## 2013-03-13 NOTE — Progress Notes (Signed)
Accessed left chest PAC with 20G Huber needle using sterile technique per MD order and D/C pt home with PAC in place

## 2013-03-20 ENCOUNTER — Encounter (HOSPITAL_COMMUNITY)
Admission: RE | Admit: 2013-03-20 | Discharge: 2013-03-20 | Disposition: A | Discharge status: Home / Self Care | Payer: 59 | Source: Ambulatory Visit | Attending: Family Medicine | Admitting: Family Medicine

## 2013-03-20 LAB — TSH: TSH: 22.008 u[IU]/mL — AB (ref 0.350–4.500)

## 2013-03-20 LAB — T4, FREE: Free T4: 0.91 ng/dL (ref 0.80–1.80)

## 2013-03-20 MED ORDER — HEPARIN SOD (PORK) LOCK FLUSH 100 UNIT/ML IV SOLN
INTRAVENOUS | Status: AC
  Administered 2013-03-20: 500 [IU]
  Filled 2013-03-20: qty 5

## 2013-03-20 MED ORDER — HEPARIN SOD (PORK) LOCK FLUSH 100 UNIT/ML IV SOLN: 500.0000 [IU] | INTRAVENOUS | Status: DC | PRN

## 2013-03-27 ENCOUNTER — Encounter (HOSPITAL_COMMUNITY)
Admission: RE | Admit: 2013-03-27 | Discharge: 2013-03-27 | Disposition: A | Discharge status: Home / Self Care | Payer: 59 | Source: Ambulatory Visit | Attending: Family Medicine | Admitting: Family Medicine

## 2013-03-27 MED ORDER — HEPARIN SOD (PORK) LOCK FLUSH 100 UNIT/ML IV SOLN: 500.0000 [IU] | INTRAVENOUS | Status: DC | PRN

## 2013-03-27 MED ORDER — HEPARIN SOD (PORK) LOCK FLUSH 100 UNIT/ML IV SOLN
INTRAVENOUS | Status: AC
  Administered 2013-03-27: 100 [IU]
  Filled 2013-03-27: qty 5

## 2013-04-02 ENCOUNTER — Other Ambulatory Visit (HOSPITAL_COMMUNITY): Payer: Self-pay | Admitting: *Deleted

## 2013-04-03 ENCOUNTER — Encounter (HOSPITAL_COMMUNITY): Payer: 59

## 2013-04-17 ENCOUNTER — Encounter (HOSPITAL_COMMUNITY): Payer: 59

## 2013-05-04 ENCOUNTER — Encounter (HOSPITAL_COMMUNITY)
Admission: RE | Admit: 2013-05-04 | Discharge: 2013-05-04 | Disposition: A | Discharge status: Home / Self Care | Payer: 59 | Source: Ambulatory Visit | Attending: Family Medicine | Admitting: Family Medicine

## 2013-05-04 DIAGNOSIS — D509 Iron deficiency anemia, unspecified: Secondary | ICD-10-CM | POA: Insufficient documentation

## 2013-05-04 DIAGNOSIS — K565 Intestinal adhesions [bands], unspecified as to partial versus complete obstruction: Secondary | ICD-10-CM | POA: Insufficient documentation

## 2013-05-04 DIAGNOSIS — E46 Unspecified protein-calorie malnutrition: Secondary | ICD-10-CM | POA: Insufficient documentation

## 2013-05-04 LAB — COMPREHENSIVE METABOLIC PANEL
ALK PHOS: 129 U/L — AB (ref 39–117)
ALT: 20 U/L (ref 0–35)
AST: 16 U/L (ref 0–37)
Albumin: 3.5 g/dL (ref 3.5–5.2)
BUN: 15 mg/dL (ref 6–23)
CHLORIDE: 101 meq/L (ref 96–112)
CO2: 27 mEq/L (ref 19–32)
Calcium: 9.3 mg/dL (ref 8.4–10.5)
Creatinine, Ser: 0.69 mg/dL (ref 0.50–1.10)
GLUCOSE: 112 mg/dL — AB (ref 70–99)
Potassium: 4.4 mEq/L (ref 3.7–5.3)
Sodium: 140 mEq/L (ref 137–147)
Total Protein: 7.1 g/dL (ref 6.0–8.3)

## 2013-05-04 LAB — DIFFERENTIAL
Basophils Absolute: 0 10*3/uL (ref 0.0–0.1)
Basophils Relative: 1 % (ref 0–1)
Eosinophils Absolute: 0 10*3/uL (ref 0.0–0.7)
Eosinophils Relative: 1 % (ref 0–5)
LYMPHS ABS: 0.5 10*3/uL — AB (ref 0.7–4.0)
Lymphocytes Relative: 10 % — ABNORMAL LOW (ref 12–46)
MONOS PCT: 5 % (ref 3–12)
Monocytes Absolute: 0.3 10*3/uL (ref 0.1–1.0)
NEUTROS ABS: 4.4 10*3/uL (ref 1.7–7.7)
NEUTROS PCT: 83 % — AB (ref 43–77)

## 2013-05-04 LAB — PHOSPHORUS: Phosphorus: 3.7 mg/dL (ref 2.3–4.6)

## 2013-05-04 LAB — CBC
HEMATOCRIT: 36.7 % (ref 36.0–46.0)
Hemoglobin: 12.2 g/dL (ref 12.0–15.0)
MCH: 27.9 pg (ref 26.0–34.0)
MCHC: 33.2 g/dL (ref 30.0–36.0)
MCV: 84 fL (ref 78.0–100.0)
Platelets: 287 10*3/uL (ref 150–400)
RBC: 4.37 MIL/uL (ref 3.87–5.11)
RDW: 14.1 % (ref 11.5–15.5)
WBC: 5.3 10*3/uL (ref 4.0–10.5)

## 2013-05-04 LAB — MAGNESIUM: MAGNESIUM: 2 mg/dL (ref 1.5–2.5)

## 2013-05-04 LAB — PREALBUMIN: PREALBUMIN: 26 mg/dL (ref 17.0–34.0)

## 2013-05-04 LAB — TRIGLYCERIDES: Triglycerides: 132 mg/dL (ref <150)

## 2013-05-04 MED ORDER — HEPARIN SOD (PORK) LOCK FLUSH 100 UNIT/ML IV SOLN
INTRAVENOUS | Status: AC
  Administered 2013-05-04: 500 [IU]
  Filled 2013-05-04: qty 5

## 2013-05-04 MED ORDER — HEPARIN SOD (PORK) LOCK FLUSH 100 UNIT/ML IV SOLN: 500.0000 [IU] | INTRAVENOUS | Status: DC | PRN

## 2013-05-04 NOTE — Progress Notes (Signed)
Accessed left chest PAC with 22g x .75in. Huber needle using sterile technique.  Labs drawn at time of access as well. Patient tolerated well.  Patient d/c'd with needle in place for home therapies.

## 2013-05-11 ENCOUNTER — Encounter (HOSPITAL_COMMUNITY)
Admission: RE | Admit: 2013-05-11 | Discharge: 2013-05-11 | Disposition: A | Discharge status: Home / Self Care | Payer: 59 | Source: Ambulatory Visit | Attending: Family Medicine | Admitting: Family Medicine

## 2013-05-11 MED ORDER — HEPARIN SOD (PORK) LOCK FLUSH 100 UNIT/ML IV SOLN
INTRAVENOUS | Status: AC
  Administered 2013-05-11: 500 [IU]
  Filled 2013-05-11: qty 5

## 2013-05-11 MED ORDER — HEPARIN SOD (PORK) LOCK FLUSH 100 UNIT/ML IV SOLN: 500.0000 [IU] | INTRAVENOUS | Status: DC | PRN

## 2013-05-11 NOTE — Progress Notes (Signed)
Patient's left upper chest port a cath accessed using a 22g x .75in. Huber needle using sterile technique.  Port a cath flushed with 10cc NS and 5mL of Heparin (100 units/mL).  Patient d/c'd with needle in place for home therapies.  Patient tolerated well.

## 2013-05-18 ENCOUNTER — Inpatient Hospital Stay (HOSPITAL_COMMUNITY): Admission: RE | Admit: 2013-05-18 | Payer: 59 | Source: Ambulatory Visit

## 2013-05-28 ENCOUNTER — Other Ambulatory Visit (HOSPITAL_COMMUNITY): Payer: Self-pay | Admitting: *Deleted

## 2013-05-29 ENCOUNTER — Encounter (HOSPITAL_COMMUNITY)
Admission: RE | Admit: 2013-05-29 | Discharge: 2013-05-29 | Disposition: A | Discharge status: Home / Self Care | Payer: 59 | Source: Ambulatory Visit | Attending: Family Medicine | Admitting: Family Medicine

## 2013-05-29 DIAGNOSIS — K565 Intestinal adhesions [bands], unspecified as to partial versus complete obstruction: Secondary | ICD-10-CM | POA: Insufficient documentation

## 2013-05-29 DIAGNOSIS — D509 Iron deficiency anemia, unspecified: Secondary | ICD-10-CM | POA: Insufficient documentation

## 2013-05-29 DIAGNOSIS — E46 Unspecified protein-calorie malnutrition: Secondary | ICD-10-CM | POA: Insufficient documentation

## 2013-05-29 MED ORDER — HEPARIN SOD (PORK) LOCK FLUSH 100 UNIT/ML IV SOLN
500.0000 [IU] | INTRAVENOUS | Status: AC | PRN
  Administered 2013-05-29: 500 [IU]

## 2013-06-04 ENCOUNTER — Other Ambulatory Visit (HOSPITAL_COMMUNITY): Payer: Self-pay | Admitting: *Deleted

## 2013-06-05 ENCOUNTER — Inpatient Hospital Stay (HOSPITAL_COMMUNITY)
Admission: RE | Admit: 2013-06-05 | Discharge: 2013-06-05 | Disposition: A | Discharge status: Home / Self Care | Payer: 59 | Source: Ambulatory Visit | Attending: Family Medicine | Admitting: Family Medicine

## 2013-06-09 ENCOUNTER — Ambulatory Visit: Payer: 59 | Admitting: *Deleted

## 2013-06-26 ENCOUNTER — Other Ambulatory Visit (HOSPITAL_COMMUNITY): Payer: Self-pay | Admitting: *Deleted

## 2013-06-29 ENCOUNTER — Encounter (HOSPITAL_COMMUNITY)
Admission: RE | Admit: 2013-06-29 | Discharge: 2013-06-29 | Disposition: A | Discharge status: Home / Self Care | Payer: 59 | Source: Ambulatory Visit | Attending: Family Medicine | Admitting: Family Medicine

## 2013-06-29 DIAGNOSIS — D509 Iron deficiency anemia, unspecified: Secondary | ICD-10-CM | POA: Insufficient documentation

## 2013-06-29 DIAGNOSIS — K565 Intestinal adhesions [bands], unspecified as to partial versus complete obstruction: Secondary | ICD-10-CM | POA: Insufficient documentation

## 2013-06-29 DIAGNOSIS — E46 Unspecified protein-calorie malnutrition: Secondary | ICD-10-CM | POA: Insufficient documentation

## 2013-06-29 LAB — COMPREHENSIVE METABOLIC PANEL
ALT: 25 U/L (ref 0–35)
AST: 29 U/L (ref 0–37)
Albumin: 3.5 g/dL (ref 3.5–5.2)
Alkaline Phosphatase: 109 U/L (ref 39–117)
BUN: 18 mg/dL (ref 6–23)
CO2: 25 meq/L (ref 19–32)
Calcium: 9.3 mg/dL (ref 8.4–10.5)
Chloride: 101 mEq/L (ref 96–112)
Creatinine, Ser: 0.71 mg/dL (ref 0.50–1.10)
GFR calc Af Amer: >90 mL/min (ref >90)
GFR calc non Af Amer: >90 mL/min (ref >90)
Glucose, Bld: 127 mg/dL — ABNORMAL HIGH (ref 70–99)
Potassium: 4.7 mEq/L (ref 3.7–5.3)
Sodium: 138 mEq/L (ref 137–147)
Total Bilirubin: <0.2 mg/dL — ABNORMAL LOW (ref 0.3–1.2)
Total Protein: 7.3 g/dL (ref 6.0–8.3)

## 2013-06-29 LAB — CBC
HEMATOCRIT: 34.6 % — AB (ref 36.0–46.0)
Hemoglobin: 11 g/dL — ABNORMAL LOW (ref 12.0–15.0)
MCH: 27.6 pg (ref 26.0–34.0)
MCHC: 31.8 g/dL (ref 30.0–36.0)
MCV: 86.9 fL (ref 78.0–100.0)
Platelets: 323 10*3/uL (ref 150–400)
RBC: 3.98 MIL/uL (ref 3.87–5.11)
RDW: 14.8 % (ref 11.5–15.5)
WBC: 6.3 10*3/uL (ref 4.0–10.5)

## 2013-06-29 LAB — DIFFERENTIAL
BASOS ABS: 0.1 10*3/uL (ref 0.0–0.1)
BASOS PCT: 1 % (ref 0–1)
EOS PCT: 2 % (ref 0–5)
Eosinophils Absolute: 0.1 10*3/uL (ref 0.0–0.7)
LYMPHS PCT: 14 % (ref 12–46)
Lymphs Abs: 0.9 10*3/uL (ref 0.7–4.0)
Monocytes Absolute: 0.4 10*3/uL (ref 0.1–1.0)
Monocytes Relative: 6 % (ref 3–12)
Neutro Abs: 4.9 10*3/uL (ref 1.7–7.7)
Neutrophils Relative %: 77 % (ref 43–77)

## 2013-06-29 LAB — MAGNESIUM: MAGNESIUM: 2.1 mg/dL (ref 1.5–2.5)

## 2013-06-29 LAB — PHOSPHORUS: Phosphorus: 3.4 mg/dL (ref 2.3–4.6)

## 2013-06-29 LAB — TRIGLYCERIDES: Triglycerides: 100 mg/dL (ref <150)

## 2013-06-29 LAB — PREALBUMIN: Prealbumin: 26 mg/dL (ref 17.0–34.0)

## 2013-06-29 MED ORDER — HEPARIN SOD (PORK) LOCK FLUSH 100 UNIT/ML IV SOLN
INTRAVENOUS | Status: AC
  Administered 2013-06-29: 500 [IU]
  Filled 2013-06-29: qty 5

## 2013-06-29 MED ORDER — HEPARIN SOD (PORK) LOCK FLUSH 100 UNIT/ML IV SOLN
INTRAVENOUS | Status: AC
  Filled 2013-06-29: qty 5

## 2013-06-29 MED ORDER — HEPARIN SOD (PORK) LOCK FLUSH 100 UNIT/ML IV SOLN: 500.0000 [IU] | INTRAVENOUS | Status: DC | PRN

## 2013-06-29 NOTE — Progress Notes (Signed)
Patient stated that tubing on PAC Huber leaks partway through the week. Requested that I document this.

## 2013-07-06 ENCOUNTER — Encounter (HOSPITAL_COMMUNITY): Payer: 59

## 2014-06-22 ENCOUNTER — Emergency Department (HOSPITAL_BASED_OUTPATIENT_CLINIC_OR_DEPARTMENT_OTHER): Admission: EM | Admit: 2014-06-22 | Discharge: 2014-06-22 | Discharge status: Absent without leave | Payer: 59

## 2016-06-13 ENCOUNTER — Other Ambulatory Visit: Payer: Self-pay | Admitting: Orthopedic Surgery

## 2016-06-13 NOTE — H&P (Signed)
Mary Hickman is an 56 y.o. female.   CC / Reason for Visit: Right small finger and right shoulder problems HPI: This patient returns to clinic today indicating that it has taken since November to be approved by workman's comp to have her hardware removed because the insurance company that was handling her case went bankrupt.  She indicates that she has not worked in just about a year.  She continues to have exquisite pain dorsally over the distal aspect of the middle phalanx.  She is here today along with her nurse case manager to discuss scheduling surgery for hardware removal and tenolysis.  HPI 01/04/2016:This patient returns for reevaluation, reporting continued significant pain at the level of the middle phalanx, particularly dorsally nearing the DIP joint.  She reports pain when this is bumped or touched.  After 2-3 minutes of vacuuming, she reports pain sufficient for her to have to stop.  She does not feel capable of engaging in her regular work with her digit the way it is.  She is mostly concerned about the need that arises to physically intervene.  She did not try to return to work on a trial basis when she was cleared by me to do that on 12-05-15.  HPI 12-05-15: This patient returns for reevaluation.  She indicates that the 12 a Dosepak was quite helpful in alleviating both her shoulder pain and helping her finger.  Then, her shoulder started to become a little sore again after the prednisone.  She takes meloxicam daily and continues in therapy.  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  She relates the onset of the pain to the twisting injury that she received that caused her finger fracture.  She is frustrated with her recent lack of progress in therapy.  She has not yet returned to work.  She takes meloxicam daily and some Percocet with  dynamic extension splint application.  She statically splints DIP joint in extension at nighttime, where she achieves nearly full extension, but loses some of this during the daytime.  No past medical history on file.  No past surgical history on file.  No family history on file. Social History:  has no tobacco, alcohol, and drug history on file.  Allergies:  Allergies  Allergen Reactions  . Morphine And Related Anaphylaxis  . Clonidine Derivatives     Bradycardia   . Codeine Nausea And Vomiting  . Hydrocodone Nausea And Vomiting  . Oxycodone Nausea Only  . Penicillins Hives    No prescriptions prior to admission.    No results found for this or any previous visit (from the past 48 hour(s)). No results found.  Review of Systems  All other systems reviewed and are negative.   There were no vitals taken for this visit. Physical Exam  Constitutional:  WD, WN, NAD HEENT:  NCAT, EOMI Neuro/Psych:  Alert & oriented to person, place, and time; appropriate mood & affect Lymphatic: No generalized UE edema or lymphadenopathy Extremities / MSK:  Both UE are normal with respect to appearance, ranges of motion, joint stability, muscle strength/tone, sensation, & perfusion except as otherwise noted:  AROM: MP 0-95, PIP 0-90, DIP 20-45.  There is a fair degree of tenderness dorsally over the distal aspect of the middle phalanx, a slight enlargement that may be related to the contour of the  hardware.  Grip strength in position 2: Right 40, left 60.  NVI.  Labs / X-rays:  No new x-rays.  3 views of the right small finger ordered and obtained on 01/04/2016 reveals continued near-anatomic alignment of the comminuted right small finger P2 shaft fracture with intact plate and screw fixation and progressive healing with remodeling.  Assessment:  1.  Continued skeletal healing of right small finger P2 shaft fracture with ongoing pain sufficient to prevent her return to work 2.  Right shoulder  pain and stiffness--improved  Plan: We discussed today's findings and her situation.  She would like to proceed with surgery at help improve upon the digit.  This would involve hardware removal, likely extensor tenolysis and possibly DIP joint release.  This is scheduled for Tuesday, 06/19/2016.  She is provided a formalized therapy prescription as well which needs to begin on 06/21/2016.  The details of the operative procedure were discussed with the patient.  Questions were invited and answered.  In addition to the goal of the procedure, the risks of the procedure to include but not limited to bleeding; infection; damage to the nerves or blood vessels that could result in bleeding, numbness, weakness, chronic pain, and the need for additional procedures; stiffness; the need for revision surgery; and anesthetic risks were reviewed.  No specific outcome was guaranteed or implied.  Informed consent was obtained.   Bellamia Ferch A., MD 06/13/2016, 4:28 PM

## 2016-06-14 ENCOUNTER — Encounter (HOSPITAL_BASED_OUTPATIENT_CLINIC_OR_DEPARTMENT_OTHER): Payer: Self-pay | Admitting: *Deleted

## 2016-06-19 ENCOUNTER — Ambulatory Visit (HOSPITAL_BASED_OUTPATIENT_CLINIC_OR_DEPARTMENT_OTHER): Payer: Worker's Compensation | Admitting: Anesthesiology

## 2016-06-19 ENCOUNTER — Ambulatory Visit (HOSPITAL_BASED_OUTPATIENT_CLINIC_OR_DEPARTMENT_OTHER)
Admission: RE | Admit: 2016-06-19 | Discharge: 2016-06-19 | Disposition: A | Discharge status: Home / Self Care | Payer: Worker's Compensation | Source: Ambulatory Visit | Attending: Orthopedic Surgery | Admitting: Orthopedic Surgery

## 2016-06-19 ENCOUNTER — Encounter (HOSPITAL_BASED_OUTPATIENT_CLINIC_OR_DEPARTMENT_OTHER): Payer: Self-pay | Admitting: *Deleted

## 2016-06-19 ENCOUNTER — Encounter (HOSPITAL_BASED_OUTPATIENT_CLINIC_OR_DEPARTMENT_OTHER)
Admission: RE | Disposition: A | Discharge status: Home / Self Care | Payer: Self-pay | Source: Ambulatory Visit | Attending: Orthopedic Surgery

## 2016-06-19 DIAGNOSIS — Z87891 Personal history of nicotine dependence: Secondary | ICD-10-CM | POA: Insufficient documentation

## 2016-06-19 DIAGNOSIS — Z885 Allergy status to narcotic agent status: Secondary | ICD-10-CM | POA: Diagnosis not present

## 2016-06-19 DIAGNOSIS — Z880 Allergy status to penicillin: Secondary | ICD-10-CM | POA: Diagnosis not present

## 2016-06-19 DIAGNOSIS — M25611 Stiffness of right shoulder, not elsewhere classified: Secondary | ICD-10-CM | POA: Insufficient documentation

## 2016-06-19 DIAGNOSIS — Y831 Surgical operation with implant of artificial internal device as the cause of abnormal reaction of the patient, or of later complication, without mention of misadventure at the time of the procedure: Secondary | ICD-10-CM | POA: Diagnosis not present

## 2016-06-19 DIAGNOSIS — T8484XA Pain due to internal orthopedic prosthetic devices, implants and grafts, initial encounter: Secondary | ICD-10-CM | POA: Insufficient documentation

## 2016-06-19 HISTORY — PX: HARDWARE REMOVAL: SHX979

## 2016-06-19 HISTORY — DX: Major depressive disorder, single episode, unspecified: F32.9

## 2016-06-19 HISTORY — DX: Bipolar disorder, unspecified: F31.9

## 2016-06-19 HISTORY — DX: Chronic vascular disorders of intestine: K55.1

## 2016-06-19 HISTORY — DX: Opioid dependence, uncomplicated: F11.20

## 2016-06-19 HISTORY — DX: Anxiety disorder, unspecified: F41.9

## 2016-06-19 HISTORY — DX: Depression, unspecified: F32.A

## 2016-06-19 SURGERY — REMOVAL, HARDWARE
Anesthesia: Monitor Anesthesia Care | Site: Finger | Laterality: Right

## 2016-06-19 MED ORDER — LIDOCAINE HCL 1 % IJ SOLN
INTRAMUSCULAR | Status: AC
  Filled 2016-06-19: qty 20

## 2016-06-19 MED ORDER — OXYCODONE HCL 5 MG PO TABS
ORAL_TABLET | ORAL | Status: AC
  Filled 2016-06-19: qty 1

## 2016-06-19 MED ORDER — CLINDAMYCIN PHOSPHATE 900 MG/50ML IV SOLN
INTRAVENOUS | Status: AC
  Filled 2016-06-19: qty 50

## 2016-06-19 MED ORDER — OXYCODONE HCL 5 MG PO TABS: 5.0000 mg | ORAL_TABLET | Freq: Four times a day (QID) | ORAL | 0 refills | Status: DC | PRN

## 2016-06-19 MED ORDER — LIDOCAINE HCL 1 % IJ SOLN
INTRAMUSCULAR | Status: DC | PRN
  Administered 2016-06-19: 10 mL via INTRAMUSCULAR

## 2016-06-19 MED ORDER — FENTANYL CITRATE (PF) 100 MCG/2ML IJ SOLN
50.0000 ug | INTRAMUSCULAR | Status: DC | PRN
  Administered 2016-06-19: 100 ug via INTRAVENOUS

## 2016-06-19 MED ORDER — PROPOFOL 10 MG/ML IV BOLUS
INTRAVENOUS | Status: DC | PRN
  Administered 2016-06-19 (×6): 20 mg via INTRAVENOUS

## 2016-06-19 MED ORDER — MIDAZOLAM HCL 2 MG/2ML IJ SOLN
1.0000 mg | INTRAMUSCULAR | Status: DC | PRN
  Administered 2016-06-19: 2 mg via INTRAVENOUS

## 2016-06-19 MED ORDER — LACTATED RINGERS IV SOLN
INTRAVENOUS | Status: DC
  Administered 2016-06-19: 15:00:00 via INTRAVENOUS

## 2016-06-19 MED ORDER — ACETAMINOPHEN 325 MG PO TABS: 650.0000 mg | ORAL_TABLET | Freq: Four times a day (QID) | ORAL | Status: AC | PRN

## 2016-06-19 MED ORDER — CLINDAMYCIN PHOSPHATE 900 MG/50ML IV SOLN
900.0000 mg | INTRAVENOUS | Status: AC
  Administered 2016-06-19: 900 mg via INTRAVENOUS

## 2016-06-19 MED ORDER — OXYCODONE HCL 5 MG PO TABS
5.0000 mg | ORAL_TABLET | Freq: Once | ORAL | Status: AC | PRN
  Administered 2016-06-19: 5 mg via ORAL

## 2016-06-19 MED ORDER — MIDAZOLAM HCL 2 MG/2ML IJ SOLN
INTRAMUSCULAR | Status: AC
  Filled 2016-06-19: qty 2

## 2016-06-19 MED ORDER — 0.9 % SODIUM CHLORIDE (POUR BTL) OPTIME
TOPICAL | Status: DC | PRN
  Administered 2016-06-19: 1000 mL

## 2016-06-19 MED ORDER — LIDOCAINE 2% (20 MG/ML) 5 ML SYRINGE
INTRAMUSCULAR | Status: DC | PRN
  Administered 2016-06-19: 50 mg via INTRAVENOUS

## 2016-06-19 MED ORDER — ONDANSETRON HCL 4 MG PO TABS: 4.0000 mg | ORAL_TABLET | Freq: Three times a day (TID) | ORAL | 0 refills | Status: DC | PRN

## 2016-06-19 MED ORDER — FENTANYL CITRATE (PF) 100 MCG/2ML IJ SOLN
25.0000 ug | INTRAMUSCULAR | Status: DC | PRN
  Administered 2016-06-19: 50 ug via INTRAVENOUS

## 2016-06-19 MED ORDER — LACTATED RINGERS IV SOLN
INTRAVENOUS | Status: DC
  Administered 2016-06-19: 14:00:00 via INTRAVENOUS

## 2016-06-19 MED ORDER — FENTANYL CITRATE (PF) 100 MCG/2ML IJ SOLN
INTRAMUSCULAR | Status: AC
  Filled 2016-06-19: qty 2

## 2016-06-19 MED ORDER — SCOPOLAMINE 1 MG/3DAYS TD PT72: 1.0000 | MEDICATED_PATCH | Freq: Once | TRANSDERMAL | Status: DC | PRN

## 2016-06-19 MED ORDER — IBUPROFEN 200 MG PO TABS: 600.0000 mg | ORAL_TABLET | Freq: Four times a day (QID) | ORAL | 0 refills | Status: AC | PRN

## 2016-06-19 MED ORDER — ACETAMINOPHEN 160 MG/5ML PO SOLN: 960.0000 mg | Freq: Once | ORAL | Status: DC

## 2016-06-19 SURGICAL SUPPLY — 47 items
BANDAGE COBAN STERILE 2 (GAUZE/BANDAGES/DRESSINGS) IMPLANT
BLADE MINI RND TIP GREEN BEAV (BLADE) ×3 IMPLANT
BLADE SURG 15 STRL LF DISP TIS (BLADE) ×1 IMPLANT
BLADE SURG 15 STRL SS (BLADE) ×2
BNDG COHESIVE 1X5 TAN STRL LF (GAUZE/BANDAGES/DRESSINGS) ×3 IMPLANT
BNDG COHESIVE 4X5 TAN STRL (GAUZE/BANDAGES/DRESSINGS) ×3 IMPLANT
BNDG CONFORM 2 STRL LF (GAUZE/BANDAGES/DRESSINGS) ×3 IMPLANT
BNDG ESMARK 4X9 LF (GAUZE/BANDAGES/DRESSINGS) ×3 IMPLANT
BNDG GAUZE 1X2.1 STRL (MISCELLANEOUS) IMPLANT
BNDG GAUZE ELAST 4 BULKY (GAUZE/BANDAGES/DRESSINGS) ×3 IMPLANT
CHLORAPREP W/TINT 26ML (MISCELLANEOUS) ×3 IMPLANT
CORDS BIPOLAR (ELECTRODE) ×3 IMPLANT
COVER BACK TABLE 60X90IN (DRAPES) ×3 IMPLANT
COVER MAYO STAND STRL (DRAPES) ×3 IMPLANT
CUFF TOURNIQUET SINGLE 18IN (TOURNIQUET CUFF) ×3 IMPLANT
DRAPE EXTREMITY T 121X128X90 (DRAPE) ×3 IMPLANT
DRAPE OEC MINIVIEW 54X84 (DRAPES) ×3 IMPLANT
DRAPE SURG 17X23 STRL (DRAPES) ×3 IMPLANT
DRSG EMULSION OIL 3X3 NADH (GAUZE/BANDAGES/DRESSINGS) ×3 IMPLANT
DRSG PAD ABDOMINAL 8X10 ST (GAUZE/BANDAGES/DRESSINGS) IMPLANT
GAUZE SPONGE 4X4 12PLY STRL LF (GAUZE/BANDAGES/DRESSINGS) ×3 IMPLANT
GLOVE BIO SURGEON STRL SZ7.5 (GLOVE) ×3 IMPLANT
GLOVE BIOGEL PI IND STRL 7.0 (GLOVE) ×1 IMPLANT
GLOVE BIOGEL PI IND STRL 8 (GLOVE) ×1 IMPLANT
GLOVE BIOGEL PI INDICATOR 7.0 (GLOVE) ×2
GLOVE BIOGEL PI INDICATOR 8 (GLOVE) ×2
GLOVE ECLIPSE 6.5 STRL STRAW (GLOVE) ×3 IMPLANT
GOWN STRL REUS W/ TWL LRG LVL3 (GOWN DISPOSABLE) ×2 IMPLANT
GOWN STRL REUS W/TWL LRG LVL3 (GOWN DISPOSABLE) ×4
GOWN STRL REUS W/TWL XL LVL3 (GOWN DISPOSABLE) ×3 IMPLANT
NEEDLE HYPO 25X1 1.5 SAFETY (NEEDLE) ×3 IMPLANT
NS IRRIG 1000ML POUR BTL (IV SOLUTION) ×3 IMPLANT
PACK BASIN DAY SURGERY FS (CUSTOM PROCEDURE TRAY) ×3 IMPLANT
PADDING CAST ABS 4INX4YD NS (CAST SUPPLIES)
PADDING CAST ABS COTTON 4X4 ST (CAST SUPPLIES) IMPLANT
STOCKINETTE 6  STRL (DRAPES) ×2
STOCKINETTE 6 STRL (DRAPES) ×1 IMPLANT
SUT ETHILON 3 0 PS 1 (SUTURE) IMPLANT
SUT VIC AB 2-0 PS2 27 (SUTURE) IMPLANT
SUT VICRYL RAPIDE 4/0 PS 2 (SUTURE) ×3 IMPLANT
SYR 10ML LL (SYRINGE) ×3 IMPLANT
SYR BULB 3OZ (MISCELLANEOUS) ×3 IMPLANT
TOWEL OR 17X24 6PK STRL BLUE (TOWEL DISPOSABLE) ×3 IMPLANT
TOWEL OR NON WOVEN STRL DISP B (DISPOSABLE) ×3 IMPLANT
TUBE CONNECTING 20'X1/4 (TUBING) ×1
TUBE CONNECTING 20X1/4 (TUBING) ×2 IMPLANT
UNDERPAD 30X30 (UNDERPADS AND DIAPERS) ×3 IMPLANT

## 2016-06-19 NOTE — Anesthesia Postprocedure Evaluation (Signed)
Anesthesia Post Note  Patient: Mary Hickman  Procedure(s) Performed: Procedure(s) (LRB): RIGHT SMALL FINGER HARDWARE REMOVAL WITH EXTENSOR TENOLYSIS AND JOINT RELEASE (Right)  Patient location during evaluation: PACU Anesthesia Type: MAC Level of consciousness: awake and alert Pain management: pain level controlled Vital Signs Assessment: post-procedure vital signs reviewed and stable Respiratory status: spontaneous breathing, nonlabored ventilation, respiratory function stable and patient connected to nasal cannula oxygen Cardiovascular status: stable and blood pressure returned to baseline Anesthetic complications: no       Last Vitals:  Vitals:   06/19/16 1600 06/19/16 1615  BP: 100/67 109/67  Pulse: 74 64  Resp: 17 14  Temp:      Last Pain:  Vitals:   06/19/16 1615  TempSrc:   PainSc: 1                  Jami Bogdanski EDWARD

## 2016-06-19 NOTE — Anesthesia Preprocedure Evaluation (Signed)
Anesthesia Evaluation  Patient identified by MRN, date of birth, ID band Patient awake    Reviewed: Allergy & Precautions, H&P , Patient's Chart, lab work & pertinent test results, reviewed documented beta blocker date and time   Airway Mallampati: II  TM Distance: >3 FB Neck ROM: full    Dental no notable dental hx.    Pulmonary former smoker,    Pulmonary exam normal breath sounds clear to auscultation       Cardiovascular  Rhythm:regular Rate:Normal     Neuro/Psych    GI/Hepatic   Endo/Other    Renal/GU      Musculoskeletal   Abdominal   Peds  Hematology   Anesthesia Other Findings   Reproductive/Obstetrics                             Anesthesia Physical Anesthesia Plan  ASA: II  Anesthesia Plan: MAC   Post-op Pain Management:    Induction: Intravenous  Airway Management Planned: Mask and Natural Airway  Additional Equipment:   Intra-op Plan:   Post-operative Plan:   Informed Consent: I have reviewed the patients History and Physical, chart, labs and discussed the procedure including the risks, benefits and alternatives for the proposed anesthesia with the patient or authorized representative who has indicated his/her understanding and acceptance.   Dental Advisory Given  Plan Discussed with: CRNA and Surgeon  Anesthesia Plan Comments:         Anesthesia Quick Evaluation

## 2016-06-19 NOTE — Interval H&P Note (Signed)
History and Physical Interval Note:  06/19/2016 1:22 PM  Mary Hickman  has presented today for surgery, with the diagnosis of RIGHT SMALL FINGER PAINFUL HARDWARE AND JOINT STIFFNESS T85.848A, M25.641  The various methods of treatment have been discussed with the patient and family. After consideration of risks, benefits and other options for treatment, the patient has consented to  Procedure(s): RIGHT SMALL FINGER HARDWARE REMOVAL WITH EXTENSOR TENOLYSIS AND JOINT RELEASE (Right) as a surgical intervention .  The patient's history has been reviewed, patient examined, no change in status, stable for surgery.  I have reviewed the patient's chart and labs.  Questions were answered to the patient's satisfaction.     Mary Hickman A.

## 2016-06-19 NOTE — Discharge Instructions (Signed)
Discharge Instructions   You have a light dressing on your hand.  You may begin gentle motion of your fingers and hand immediately, but you should not do any heavy lifting or gripping.  Elevate your hand to reduce pain & swelling of the digits.  Ice over the operative site or in arm pit may be helpful to reduce pain & swelling.  DO NOT USE HEAT. Pain medicine has been prescribed for you.  Take Advil and Tylenol OTC every 6 hours. Add Oxycodone 5 mg for severe break through pain. Leave the dressing in place until the third day after your surgery and then remove it, leaving it open to air.  After the bandage has been removed you may shower, regularly washing the incision and letting the water run over it, but not submerging it (no swimming, soaking it in dishwater, etc.) You may drive a car when you are off of prescription pain medications and can safely control your vehicle with both hands. We will address whether therapy will be required or not when you return to the office. You may have already made your follow-up appointment when we completed your preop visit.  If not, please call our office today or the next business day to make your return appointment for 10-15 days after surgery.   Please call [phone] during normal business hours or [phone] after hours for any problems. Including the following:  - excessive redness of the incisions - drainage for more than 4 days - fever of more than 101.5 F  *Please note that pain medications will not be refilled after hours or on weekends.  OUT of work until you return for follow up appointment.   Post Anesthesia Home Care Instructions  Activity: Get plenty of rest for the remainder of the day. A responsible individual must stay with you for 24 hours following the procedure.  For the next 24 hours, DO NOT: -Drive a car -Advertising copywriter -Drink alcoholic beverages -Take any medication unless instructed by your physician -Make any  legal decisions or sign important papers.  Meals: Start with liquid foods such as gelatin or soup. Progress to regular foods as tolerated. Avoid greasy, spicy, heavy foods. If nausea and/or vomiting occur, drink only clear liquids until the nausea and/or vomiting subsides. Call your physician if vomiting continues.  Special Instructions/Symptoms: Your throat may feel dry or sore from the anesthesia or the breathing tube placed in your throat during surgery. If this causes discomfort, gargle with warm salt water. The discomfort should disappear within 24 hours.  If you had a scopolamine patch placed behind your ear for the management of post- operative nausea and/or vomiting:  1. The medication in the patch is effective for 72 hours, after which it should be removed.  Wrap patch in a tissue and discard in the trash. Wash hands thoroughly with soap and water. 2. You may remove the patch earlier than 72 hours if you experience unpleasant side effects which may include dry mouth, dizziness or visual disturbances. 3. Avoid touching the patch. Wash your hands with soap and water after contact with the patch.

## 2016-06-19 NOTE — Transfer of Care (Signed)
Immediate Anesthesia Transfer of Care Note  Patient: Mary Hickman  Procedure(s) Performed: Procedure(s): RIGHT SMALL FINGER HARDWARE REMOVAL WITH EXTENSOR TENOLYSIS AND JOINT RELEASE (Right)  Patient Location: PACU  Anesthesia Type:MAC  Level of Consciousness: awake, alert  and oriented  Airway & Oxygen Therapy: Patient Spontanous Breathing and Patient connected to face mask oxygen  Post-op Assessment: Report given to RN and Post -op Vital signs reviewed and stable  Post vital signs: Reviewed and stable  Last Vitals:  Vitals:   06/19/16 1329  BP: (!) 142/72  Pulse: 70  Resp: 18  Temp: 36.9 C    Last Pain:  Vitals:   06/19/16 1329  TempSrc: Oral  PainSc: 5          Complications: No apparent anesthesia complications

## 2016-06-19 NOTE — Op Note (Signed)
06/19/2016  1:22 PM  PATIENT:  Mary Hickman  56 y.o. female  PRE-OPERATIVE DIAGNOSIS:  Painful R SF orthopaedic implant with stiffness of her DIPJ  POST-OPERATIVE DIAGNOSIS:  Same  PROCEDURE:   1. R SF removal of hardware    2. R SF extensor tenolysis    3. R SF DIPJ release and manipulation  SURGEON: Cliffton Asters. Janee Morn, MD  PHYSICIAN ASSISTANT: Danielle Rankin, OPA-C  ANESTHESIA:  general  SPECIMENS:  None  DRAINS:   None  EBL:  less than 50 mL  PREOPERATIVE INDICATIONS:  [name] is a  56 y.o. female with a h/o ORIF of R SF P2 fx, which has healed, but with pain related to the implant and stiffness at the DIPJ  The risks benefits and alternatives were discussed with the patient preoperatively including but not limited to the risks of infection, bleeding, nerve injury, cardiopulmonary complications, the need for revision surgery, among others, and the patient verbalized understanding and consented to proceed.  OPERATIVE IMPLANTS: none  OPERATIVE PROCEDURE:  After receiving prophylactic antibiotics, the patient was escorted to the operative theatre and placed in a supine position.  A digital block was performed by me.  A surgical "time-out" was performed during which the planned procedure, proposed operative site, and the correct patient identity were compared to the operative consent and agreement confirmed by the circulating nurse according to current facility policy.  Following application of a tourniquet to the operative extremity, the exposed skin was prepped with Chloraprep and draped in the usual sterile fashion.  The limb was exsanguinated with an Esmarch bandage and the tourniquet inflated to approximately higher than systolic BP.  The previous radial mid axial incision was made again, and extended proximally just past the PIP joint.  Skin was reflected sharply, and the radial edge of the tendon identified overlying the plate.  Soft tissue was dissected off of  the plate, screws were removed and the plate was removed.  The slight extra bit of buildup bone distal to the plate on the radial side at the DIP level was smoothed with a rongeur.  Using a combination of a [place] Pharmacist, hospital, the extensor tendon was dissected off of the distal phalanx, freeing its attachment distally to the base of the distal phalanx.  In addition, the dorsal capsule of the DIP joint was divided.  The extensor tendon 10 a lysis was carried out proximal to the PIP joint using a Therapist, nutritional.  Since this was out of the zone of injury, the adhesions there were minimal.  This allowed for freeing of the extensor apparatus both to its insertion at the base of the middle phalanx as well as the distal phalanx.  The finger was manipulated into full flexion, and lysis of adhesions at the DIP joint was encountered, where the DIP joint was able to obtain full flexion.  The extensor apparatus was again observed to ensure that it remained in continuity.  The tourniquet was released, some additional hemostasis obtained and the wound is copiously irrigated before the skin was closed with 4-0 Vicryl Rapide interrupted sutures.  A light dressing was applied and she was taken to the recovery room in stable condition  DISPOSITION: She'll be discharged home today with typical instructions, beginning therapy on Thursday, removing her dressing in 72 hours, with planned follow-up in 10-15 days.  Out of work until follow-up.

## 2016-06-20 ENCOUNTER — Encounter (HOSPITAL_BASED_OUTPATIENT_CLINIC_OR_DEPARTMENT_OTHER): Payer: Self-pay | Admitting: Orthopedic Surgery

## 2016-08-15 ENCOUNTER — Other Ambulatory Visit: Payer: Self-pay | Admitting: Orthopedic Surgery

## 2016-08-15 ENCOUNTER — Encounter (HOSPITAL_BASED_OUTPATIENT_CLINIC_OR_DEPARTMENT_OTHER): Payer: Self-pay | Admitting: *Deleted

## 2016-08-17 NOTE — H&P (Signed)
Mary Hickman is an 56 y.o. female.   Chief Complaint: R SF problem HPI: This patient returns for reevaluation earlier than originally scheduled, indicating that the Neurontin continues to help with sensitivity, but despite this, she continues to have pain on the dorsum of her DIP especially with any type of extension splinting.  She indicates it feels as though it is burning and she is only able to keep it on for approximately 45 minutes on average before she needs to remove it.  She has some concerns about functionality as she is right-hand dominant.  She does not feel as if she can live with it in the current flexed position and would like to consider potential surgical pinning.    Past Medical History:  Diagnosis Date  . Anxiety   . Bipolar disorder (HCC)   . Depression   . Opioid dependence (HCC)   . SMAS (superior mesenteric artery syndrome) [facility])     Past Surgical History:  Procedure Laterality Date  . ABDOMINAL HYSTERECTOMY     1980's  . CHOLECYSTECTOMY    . HARDWARE REMOVAL Right 06/19/2016   Procedure: RIGHT SMALL FINGER HARDWARE REMOVAL WITH EXTENSOR TENOLYSIS AND JOINT RELEASE;  Surgeon: Mack Hook, MD;  Location: [facility];  Service: Orthopedics;  Laterality: Right;  . port-a-cath placement    . PORT-A-CATH REMOVAL    . Surgery to reroute stomach      History reviewed. No pertinent family history. Social History:  reports that she has quit smoking. She has never used smokeless tobacco. She reports that she drinks alcohol. She reports that she does not use drugs.  Allergies:  Allergies  Allergen Reactions  . Morphine And Related Anaphylaxis  . Penicillins Hives    No prescriptions prior to admission.    No results found for this or any previous visit (from the past 48 hour(s)). No results found.  ROS  Height 5\' 5"  (1.651 m), weight 55.3 kg (122 lb). Physical Exam  Constitutional:  WD, WN, NAD HEENT:  NCAT, EOMI Neuro/Psych:  Alert &  oriented to person, place, and time; appropriate mood & affect Lymphatic: No generalized UE edema or lymphadenopathy Extremities / MSK:  Both UE are normal with respect to appearance, ranges of motion, joint stability, muscle strength/tone, sensation, & perfusion except as otherwise noted: The incision continues to mature.  The right small finger DIP joint rests flexed to 50, she can actively extend to about 20 shy of neutral, with a 5-10 flexion contracture as well.  The dorsum of the right DIP is erythematous and continues to be somewhat painful to light touch.  Assessment/Plan R SF DIP  Extensor lag s/p ORIF with tenolysis and removal of hardware--unable to tolerate external splinting  I discussed these findings with her.  The DIP joint rests fairly flexed, but has both a small flexion contracture and extensor lag.  At this point, she is unable to tolerate external splinting or bracing, and finds the DIP flexed posture to be objectionable both functionally and from the standpoint of its resting appearance.  We discussed options, and in the absence of the ability to provide external bracing, one could provide internal bracing with a K wire that may or may not be removed in the future.  1 of the risks of holding the joint in extension with a wire has an internal braces that the joint is likely to become stiff in that posture, she understands that she may ultimately lose some active flexion as a  result.  Additionally, there can be recurrent drooping and loss of correction, particularly if the pin is removed too soon.  After careful consideration of deliberation, she indicated that she wishes to proceed with percutaneous stabilization of her acquired mallet deformity and we will proceed as soon as authorization is achieved  Janee MornHOMPSON, Tia Gelb A., MD 08/17/2016, 1:46 PM

## 2016-08-21 ENCOUNTER — Encounter (HOSPITAL_BASED_OUTPATIENT_CLINIC_OR_DEPARTMENT_OTHER)
Admission: RE | Disposition: A | Discharge status: Home / Self Care | Payer: Self-pay | Source: Ambulatory Visit | Attending: Orthopedic Surgery

## 2016-08-21 ENCOUNTER — Ambulatory Visit (HOSPITAL_COMMUNITY): Payer: Worker's Compensation

## 2016-08-21 ENCOUNTER — Encounter (HOSPITAL_BASED_OUTPATIENT_CLINIC_OR_DEPARTMENT_OTHER): Payer: Self-pay | Admitting: *Deleted

## 2016-08-21 ENCOUNTER — Ambulatory Visit (HOSPITAL_BASED_OUTPATIENT_CLINIC_OR_DEPARTMENT_OTHER)
Admission: RE | Admit: 2016-08-21 | Discharge: 2016-08-21 | Disposition: A | Discharge status: Home / Self Care | Payer: Worker's Compensation | Source: Ambulatory Visit | Attending: Orthopedic Surgery | Admitting: Orthopedic Surgery

## 2016-08-21 ENCOUNTER — Ambulatory Visit (HOSPITAL_BASED_OUTPATIENT_CLINIC_OR_DEPARTMENT_OTHER): Payer: Worker's Compensation | Admitting: Anesthesiology

## 2016-08-21 DIAGNOSIS — Z880 Allergy status to penicillin: Secondary | ICD-10-CM | POA: Diagnosis not present

## 2016-08-21 DIAGNOSIS — Z87891 Personal history of nicotine dependence: Secondary | ICD-10-CM | POA: Diagnosis not present

## 2016-08-21 DIAGNOSIS — Z885 Allergy status to narcotic agent status: Secondary | ICD-10-CM | POA: Insufficient documentation

## 2016-08-21 DIAGNOSIS — M20011 Mallet finger of right finger(s): Secondary | ICD-10-CM | POA: Diagnosis not present

## 2016-08-21 DIAGNOSIS — Z79899 Other long term (current) drug therapy: Secondary | ICD-10-CM | POA: Insufficient documentation

## 2016-08-21 DIAGNOSIS — Z419 Encounter for procedure for purposes other than remedying health state, unspecified: Secondary | ICD-10-CM

## 2016-08-21 HISTORY — PX: PERCUTANEOUS PINNING: SHX2209

## 2016-08-21 SURGERY — PINNING, EXTREMITY, PERCUTANEOUS
Anesthesia: Monitor Anesthesia Care | Site: Finger | Laterality: Right

## 2016-08-21 MED ORDER — DEXAMETHASONE SODIUM PHOSPHATE 10 MG/ML IJ SOLN
INTRAMUSCULAR | Status: AC
  Filled 2016-08-21: qty 1

## 2016-08-21 MED ORDER — PROMETHAZINE HCL 25 MG/ML IJ SOLN: 6.2500 mg | INTRAMUSCULAR | Status: DC | PRN

## 2016-08-21 MED ORDER — FENTANYL CITRATE (PF) 100 MCG/2ML IJ SOLN: 25.0000 ug | INTRAMUSCULAR | Status: DC | PRN

## 2016-08-21 MED ORDER — MIDAZOLAM HCL 5 MG/5ML IJ SOLN
INTRAMUSCULAR | Status: DC | PRN
  Administered 2016-08-21: 2 mg via INTRAVENOUS

## 2016-08-21 MED ORDER — MIDAZOLAM HCL 2 MG/2ML IJ SOLN
INTRAMUSCULAR | Status: AC
  Filled 2016-08-21: qty 2

## 2016-08-21 MED ORDER — ONDANSETRON HCL 4 MG/2ML IJ SOLN
INTRAMUSCULAR | Status: AC
  Filled 2016-08-21: qty 2

## 2016-08-21 MED ORDER — FENTANYL CITRATE (PF) 100 MCG/2ML IJ SOLN
INTRAMUSCULAR | Status: AC
  Filled 2016-08-21: qty 2

## 2016-08-21 MED ORDER — PROPOFOL 10 MG/ML IV BOLUS
INTRAVENOUS | Status: AC
  Filled 2016-08-21: qty 20

## 2016-08-21 MED ORDER — OXYCODONE HCL 5 MG PO TABS: 5.0000 mg | ORAL_TABLET | Freq: Four times a day (QID) | ORAL | 0 refills | Status: AC | PRN

## 2016-08-21 MED ORDER — FENTANYL CITRATE (PF) 100 MCG/2ML IJ SOLN
INTRAMUSCULAR | Status: DC | PRN
  Administered 2016-08-21: 100 ug via INTRAVENOUS

## 2016-08-21 MED ORDER — CLINDAMYCIN PHOSPHATE 900 MG/50ML IV SOLN
INTRAVENOUS | Status: AC
  Filled 2016-08-21: qty 50

## 2016-08-21 MED ORDER — CLINDAMYCIN PHOSPHATE 900 MG/50ML IV SOLN
INTRAVENOUS | Status: DC | PRN
  Administered 2016-08-21: 900 mg via INTRAVENOUS

## 2016-08-21 MED ORDER — PROPOFOL 10 MG/ML IV BOLUS
INTRAVENOUS | Status: DC | PRN
  Administered 2016-08-21: 30 mg via INTRAVENOUS
  Administered 2016-08-21: 20 mg via INTRAVENOUS

## 2016-08-21 MED ORDER — LIDOCAINE HCL (CARDIAC) 20 MG/ML IV SOLN
INTRAVENOUS | Status: DC | PRN
  Administered 2016-08-21: 10 mg via INTRAVENOUS

## 2016-08-21 MED ORDER — BUPIVACAINE-EPINEPHRINE (PF) 0.5% -1:200000 IJ SOLN
INTRAMUSCULAR | Status: DC | PRN
  Administered 2016-08-21: 5 mL

## 2016-08-21 MED ORDER — LACTATED RINGERS IV SOLN
INTRAVENOUS | Status: DC | PRN
  Administered 2016-08-21: 14:00:00 via INTRAVENOUS

## 2016-08-21 SURGICAL SUPPLY — 46 items
BANDAGE COBAN STERILE 2 (GAUZE/BANDAGES/DRESSINGS) IMPLANT
BLADE MINI RND TIP GREEN BEAV (BLADE) IMPLANT
BLADE SURG 15 STRL LF DISP TIS (BLADE) IMPLANT
BLADE SURG 15 STRL SS (BLADE)
BNDG COHESIVE 1X5 TAN STRL LF (GAUZE/BANDAGES/DRESSINGS) ×3 IMPLANT
BNDG COHESIVE 4X5 TAN STRL (GAUZE/BANDAGES/DRESSINGS) IMPLANT
BNDG CONFORM 2 STRL LF (GAUZE/BANDAGES/DRESSINGS) ×3 IMPLANT
BNDG ESMARK 4X9 LF (GAUZE/BANDAGES/DRESSINGS) IMPLANT
BNDG GAUZE ELAST 4 BULKY (GAUZE/BANDAGES/DRESSINGS) ×3 IMPLANT
CHLORAPREP W/TINT 26ML (MISCELLANEOUS) ×3 IMPLANT
CORDS BIPOLAR (ELECTRODE) IMPLANT
COVER BACK TABLE 60X90IN (DRAPES) ×3 IMPLANT
COVER MAYO STAND STRL (DRAPES) ×3 IMPLANT
CUFF TOURNIQUET SINGLE 18IN (TOURNIQUET CUFF) IMPLANT
DRAPE C-ARM 42X72 X-RAY (DRAPES) ×3 IMPLANT
DRAPE EXTREMITY T 121X128X90 (DRAPE) ×3 IMPLANT
DRAPE SURG 17X23 STRL (DRAPES) ×3 IMPLANT
DRSG EMULSION OIL 3X3 NADH (GAUZE/BANDAGES/DRESSINGS) IMPLANT
GAUZE SPONGE 4X4 12PLY STRL LF (GAUZE/BANDAGES/DRESSINGS) ×3 IMPLANT
GLOVE BIO SURGEON STRL SZ7.5 (GLOVE) ×3 IMPLANT
GLOVE BIOGEL PI IND STRL 7.0 (GLOVE) ×2 IMPLANT
GLOVE BIOGEL PI IND STRL 8 (GLOVE) ×1 IMPLANT
GLOVE BIOGEL PI INDICATOR 7.0 (GLOVE) ×4
GLOVE BIOGEL PI INDICATOR 8 (GLOVE) ×2
GLOVE ECLIPSE 6.5 STRL STRAW (GLOVE) ×6 IMPLANT
GOWN STRL REUS W/ TWL LRG LVL3 (GOWN DISPOSABLE) ×2 IMPLANT
GOWN STRL REUS W/TWL LRG LVL3 (GOWN DISPOSABLE) ×4
GOWN STRL REUS W/TWL XL LVL3 (GOWN DISPOSABLE) ×3 IMPLANT
K-WIRE .062X4 (WIRE) ×3 IMPLANT
NEEDLE HYPO 22GX1.5 SAFETY (NEEDLE) IMPLANT
NS IRRIG 1000ML POUR BTL (IV SOLUTION) ×3 IMPLANT
PACK BASIN DAY SURGERY FS (CUSTOM PROCEDURE TRAY) ×3 IMPLANT
PADDING CAST ABS 4INX4YD NS (CAST SUPPLIES)
PADDING CAST ABS COTTON 4X4 ST (CAST SUPPLIES) IMPLANT
PADDING UNDERCAST 2 STRL (CAST SUPPLIES)
PADDING UNDERCAST 2X4 STRL (CAST SUPPLIES) IMPLANT
RUBBERBAND STERILE (MISCELLANEOUS) IMPLANT
STOCKINETTE 6  STRL (DRAPES) ×2
STOCKINETTE 6 STRL (DRAPES) ×1 IMPLANT
SUT VICRYL RAPIDE 4-0 (SUTURE) IMPLANT
SUT VICRYL RAPIDE 4/0 PS 2 (SUTURE) IMPLANT
SYR 10ML LL (SYRINGE) IMPLANT
SYR BULB 3OZ (MISCELLANEOUS) IMPLANT
TOWEL OR 17X24 6PK STRL BLUE (TOWEL DISPOSABLE) ×3 IMPLANT
TOWEL OR NON WOVEN STRL DISP B (DISPOSABLE) ×3 IMPLANT
UNDERPAD 30X30 (UNDERPADS AND DIAPERS) ×3 IMPLANT

## 2016-08-21 NOTE — Anesthesia Procedure Notes (Signed)
Procedure Name: MAC Date/Time: 08/21/2016 3:10 PM Performed by: Marrianne Mood Pre-anesthesia Checklist: Patient identified, Timeout performed, Emergency Drugs available, Suction available and Patient being monitored Patient Re-evaluated:Patient Re-evaluated prior to inductionOxygen Delivery Method: Simple face mask Preoxygenation: Pre-oxygenation with 100% oxygen

## 2016-08-21 NOTE — Transfer of Care (Signed)
Immediate Anesthesia Transfer of Care Note  Patient: Mary Hickman  Procedure(s) Performed: Procedure(s): PERCUTANEOUS PINNING OF RIGHT SMALL FINGER MALLET DEFORMITY (Right)  Patient Location: PACU  Anesthesia Type:MAC  Level of Consciousness: awake and patient cooperative  Airway & Oxygen Therapy: Patient Spontanous Breathing and Patient connected to face mask oxygen  Post-op Assessment: Report given to RN and Post -op Vital signs reviewed and stable  Post vital signs: Reviewed and stable  Last Vitals:  Vitals:   08/21/16 1306  BP: 131/69  Pulse: (!) 57  Resp: 18  Temp: 36.9 C    Last Pain:  Vitals:   08/21/16 1306  TempSrc: Oral         Complications: No apparent anesthesia complications

## 2016-08-21 NOTE — Anesthesia Preprocedure Evaluation (Signed)
Anesthesia Evaluation  Patient identified by MRN, date of birth, ID band Patient awake    Reviewed: Allergy & Precautions, NPO status , Patient's Chart, lab work & pertinent test results  Airway Mallampati: II  TM Distance: >3 FB Neck ROM: Full    Dental  (+) Teeth Intact, Dental Advisory Given, Caps,    Pulmonary former smoker,    Pulmonary exam normal breath sounds clear to auscultation       Cardiovascular negative cardio ROS Normal cardiovascular exam Rhythm:Regular Rate:Normal     Neuro/Psych PSYCHIATRIC DISORDERS Anxiety Depression Bipolar Disorder negative neurological ROS     GI/Hepatic negative GI ROS, Neg liver ROS,   Endo/Other  negative endocrine ROS  Renal/GU negative Renal ROS     Musculoskeletal negative musculoskeletal ROS (+) Arthritis , Osteoarthritis,    Abdominal   Peds  Hematology negative hematology ROS (+)   Anesthesia Other Findings Day of surgery medications reviewed with the patient.  Reproductive/Obstetrics                             Anesthesia Physical Anesthesia Plan  ASA: II  Anesthesia Plan: MAC   Post-op Pain Management:    Induction: Intravenous  PONV Risk Score and Plan: 2 and Ondansetron, Dexamethasone, Midazolam and Propofol  Airway Management Planned: Nasal Cannula  Additional Equipment:   Intra-op Plan:   Post-operative Plan:   Informed Consent: I have reviewed the patients History and Physical, chart, labs and discussed the procedure including the risks, benefits and alternatives for the proposed anesthesia with the patient or authorized representative who has indicated his/her understanding and acceptance.   Dental advisory given  Plan Discussed with: CRNA  Anesthesia Plan Comments: (Discussed risks/benefits/alternatives to MAC sedation including need for ventilatory support, hypotension, need for conversion to general anesthesia.   All patient questions answered.  Patient/guardian wishes to proceed.)        Anesthesia Quick Evaluation

## 2016-08-21 NOTE — Interval H&P Note (Signed)
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 After consideration of risks, benefits and other options for treatment, the patient has consented to  Procedure(s): PERCUTANEOUS PINNING OF RIGHT SMALL FINGER MALLET DEFORMITY (Right) as a surgical intervention .  The patient's history has been reviewed, patient examined, no change in status, stable for surgery.  I have reviewed the patient's chart and labs.  Questions were answered to the patient's satisfaction.     Joda Braatz A.

## 2016-08-21 NOTE — Op Note (Signed)
08/21/2016  1:15 PM  PATIENT:  Mary Hickman  56 y.o. female  PRE-OPERATIVE DIAGNOSIS:  Right small finger soft tissue mallet deformity  POST-OPERATIVE DIAGNOSIS:  Same  PROCEDURE:  Percutaneous pinning right small finger mallet deformity  SURGEON: Rayvon Char. Grandville Silos, MD  PHYSICIAN ASSISTANT: Morley Kos, OPA-C  ANESTHESIA:  local and MAC  SPECIMENS:  None  DRAINS:   None  EBL:  less than 50 mL  PREOPERATIVE INDICATIONS:  [name] is a  56 y.o. female with development of progressive mallet deformity of the right small finger following removal of hardware from the middle phalanx and extensor tendon lysis.  She was unable to tolerate the contact of external splinting.  The risks benefits and alternatives were discussed with the patient preoperatively including but not limited to the risks of infection, bleeding, nerve injury, cardiopulmonary complications, the need for revision surgery, among others, and the patient verbalized understanding and consented to proceed.  OPERATIVE IMPLANTS: 0.062 inch K wires 1  OPERATIVE PROCEDURE:  After receiving prophylactic antibiotics, the patient was escorted to the operative theatre and placed in a supine position.  A digital block was performed by me with a mixture of lidocaine and Marcaine bearing epinephrine.   A surgical "time-out" was performed during which the planned procedure, proposed operative site, and the correct patient identity were compared to the operative consent and agreement confirmed by the circulating nurse according to current facility policy.  Following application of a tourniquet to the operative extremity, the exposed skin was prepped with Chloraprep and draped in the usual sterile fashion.    The joint was manipulated slowly and steadily, and could not achieve full extension, but did make improvement to a very slightly flexed posture.  In this posture longitudinal 0.062 inch K wire was driven through the distal  phalanx into the middle phalanx.  Final fluoroscopic images were saved and the pin was clipped at the tip of the digit to rest below the skin surface.  A light dressing was applied.  She was taken to the recovery room stable condition.  DISPOSITION: She'll be discharged home today with typical instructions, returning in a month, no x-rays required.

## 2016-08-21 NOTE — Discharge Instructions (Signed)
Discharge Instructions   You have a light dressing on your hand.  You may begin gentle motion of your fingers and hand immediately, but you should not do any heavy lifting or gripping.  Elevate your hand to reduce pain & swelling of the digits.  Ice over the operative site may be helpful to reduce pain & swelling.  DO NOT USE HEAT. Pain medicine has been prescribed for you.  Take ibuprofen 600 mg OTC and Tylenol 650 mg OTC every 6 hours together. Take prescribed pain medicine specifically for severe break though pain. Leave the dressing in place until the third day after your surgery and then remove it, leaving it open to air.  After the bandage has been removed you may shower, regularly washing the incision and letting the water run over it, but not submerging it (no swimming, soaking it in dishwater, etc.) You may drive a car when you are off of prescription pain medications and can safely control your vehicle with both hands. We will address whether therapy will be required or not when you return to the office. You may have already made your follow-up appointment when we completed your preop visit.  If not, please call our office today or the next business day to make your return appointment for 1 month  after surgery. Use protective splint as needed.   Please call [phone] during normal business hours or [phone] after hours for any problems. Including the following:  - excessive redness of the incisions - drainage for more than 4 days - fever of more than 101.5 F  *Please note that pain medications will not be refilled after hours or on weekends.  Work Status: With the right hand, no lifting gripping or grasping more than paper and pencil tasks.   Regional Anesthesia Blocks  1. Numbness or the inability to move the "blocked" extremity may last from 3-48 hours after placement. The length of time depends on the medication injected and your individual response to the medication.  If the numbness is not going away after 48 hours, call your surgeon.  2. The extremity that is blocked will need to be protected until the numbness is gone and the  Strength has returned. Because you cannot feel it, you will need to take extra care to avoid injury. Because it may be weak, you may have difficulty moving it or using it. You may not know what position it is in without looking at it while the block is in effect.  3. For blocks in the legs and feet, returning to weight bearing and walking needs to be done carefully. You will need to wait until the numbness is entirely gone and the strength has returned. You should be able to move your leg and foot normally before you try and bear weight or walk. You will need someone to be with you when you first try to ensure you do not fall and possibly risk injury.  4. Bruising and tenderness at the needle site are common side effects and will resolve in a few days.  5. Persistent numbness or new problems with movement should be communicated to the surgeon or the [facility] [phone] [facility] [phone]).    Post Anesthesia Home Care Instructions  Activity: Get plenty of rest for the remainder of the day. A responsible individual must stay with you for 24 hours following the procedure.  For the next 24 hours, DO NOT: -Drive a car -Advertising copywriter -Drink alcoholic  beverages -Take any medication unless instructed by your physician -Make any legal decisions or sign important papers.  Meals: Start with liquid foods such as gelatin or soup. Progress to regular foods as tolerated. Avoid greasy, spicy, heavy foods. If nausea and/or vomiting occur, drink only clear liquids until the nausea and/or vomiting subsides. Call your physician if vomiting continues.  Special Instructions/Symptoms: Your throat may feel dry or sore from the anesthesia or the breathing tube placed in your throat during surgery. If this  causes discomfort, gargle with warm salt water. The discomfort should disappear within 24 hours.  If you had a scopolamine patch placed behind your ear for the management of post- operative nausea and/or vomiting:  1. The medication in the patch is effective for 72 hours, after which it should be removed.  Wrap patch in a tissue and discard in the trash. Wash hands thoroughly with soap and water. 2. You may remove the patch earlier than 72 hours if you experience unpleasant side effects which may include dry mouth, dizziness or visual disturbances. 3. Avoid touching the patch. Wash your hands with soap and water after contact with the patch.

## 2016-08-22 ENCOUNTER — Encounter (HOSPITAL_BASED_OUTPATIENT_CLINIC_OR_DEPARTMENT_OTHER): Payer: Self-pay | Admitting: Orthopedic Surgery

## 2016-08-22 NOTE — Anesthesia Postprocedure Evaluation (Signed)
Anesthesia Post Note  Patient: DON GIARRUSSO  Procedure(s) Performed: Procedure(s) (LRB): PERCUTANEOUS PINNING OF RIGHT SMALL FINGER MALLET DEFORMITY (Right)     Patient location during evaluation: PACU Anesthesia Type: MAC Level of consciousness: awake and alert Pain management: pain level controlled Vital Signs Assessment: post-procedure vital signs reviewed and stable Respiratory status: spontaneous breathing, nonlabored ventilation, respiratory function stable and patient connected to nasal cannula oxygen Cardiovascular status: stable and blood pressure returned to baseline Anesthetic complications: no Comments: No antiemetics given due to MAC procedure, and no patient complaint of nausea/vomiting.     Last Vitals:  Vitals:   08/21/16 1545 08/21/16 1608  BP: 108/63 (!) 119/52  Pulse: 62 60  Resp: 19 20  Temp:  36.4 C    Last Pain:  Vitals:   08/22/16 1016  TempSrc:   PainSc: 1                  Catalina Gravel

## 2016-09-07 NOTE — Anesthesia Postprocedure Evaluation (Signed)
Anesthesia Post Note  Patient: Mary Hickman  Procedure(s) Performed: Procedure(s) (LRB): RIGHT SMALL FINGER HARDWARE REMOVAL WITH EXTENSOR TENOLYSIS AND JOINT RELEASE (Right)     Anesthesia Post Evaluation  Last Vitals:  Vitals:   06/19/16 1615 06/19/16 1641  BP: 109/67 110/68  Pulse: 64 63  Resp: 14 16  Temp:  36.4 C    Last Pain:  Vitals:   06/20/16 0919  TempSrc:   PainSc: 2                  Zuzu Befort EDWARD

## 2016-09-07 NOTE — Addendum Note (Signed)
Addendum  created 09/07/16 1137 by Remedy Corporan, MD   Sign clinical note    

## 2022-09-25 ENCOUNTER — Other Ambulatory Visit (HOSPITAL_BASED_OUTPATIENT_CLINIC_OR_DEPARTMENT_OTHER): Payer: Self-pay
